# Patient Record
Sex: Female | Born: 1980 | Hispanic: Yes | Marital: Married | Smoking: Never smoker
Health system: Southern US, Community
[De-identification: ages and names within clinical notes are randomized; demographics above are authoritative.]

## PROBLEM LIST (undated history)

## (undated) DIAGNOSIS — E049 Nontoxic goiter, unspecified: Secondary | ICD-10-CM

## (undated) DIAGNOSIS — E6609 Other obesity due to excess calories: Secondary | ICD-10-CM

## (undated) DIAGNOSIS — L709 Acne, unspecified: Secondary | ICD-10-CM

## (undated) DIAGNOSIS — IMO0002 Reserved for concepts with insufficient information to code with codable children: Secondary | ICD-10-CM

## (undated) DIAGNOSIS — R87619 Unspecified abnormal cytological findings in specimens from cervix uteri: Secondary | ICD-10-CM

## (undated) DIAGNOSIS — Z6839 Body mass index (BMI) 39.0-39.9, adult: Secondary | ICD-10-CM

## (undated) DIAGNOSIS — D689 Coagulation defect, unspecified: Secondary | ICD-10-CM

## (undated) DIAGNOSIS — T7840XA Allergy, unspecified, initial encounter: Secondary | ICD-10-CM

## (undated) HISTORY — PX: TONSILLECTOMY: SUR1361

## (undated) HISTORY — PX: TUBAL LIGATION: SHX77

## (undated) HISTORY — DX: Coagulation defect, unspecified: D68.9

## (undated) HISTORY — DX: Acne, unspecified: L70.9

## (undated) HISTORY — DX: Other obesity due to excess calories: E66.09

## (undated) HISTORY — DX: Unspecified abnormal cytological findings in specimens from cervix uteri: R87.619

## (undated) HISTORY — DX: Body mass index (bmi) 39.0-39.9, adult: Z68.39

## (undated) HISTORY — DX: Reserved for concepts with insufficient information to code with codable children: IMO0002

## (undated) HISTORY — DX: Nontoxic goiter, unspecified: E04.9

## (undated) HISTORY — DX: Allergy, unspecified, initial encounter: T78.40XA

---

## 2007-11-23 HISTORY — PX: OTHER SURGICAL HISTORY: SHX169

## 2009-12-08 ENCOUNTER — Ambulatory Visit: Payer: Self-pay | Admitting: Advanced Practice Midwife

## 2011-01-18 ENCOUNTER — Other Ambulatory Visit: Payer: Self-pay | Admitting: Physician Assistant

## 2011-01-18 ENCOUNTER — Ambulatory Visit: Payer: 59

## 2011-01-18 DIAGNOSIS — Z01419 Encounter for gynecological examination (general) (routine) without abnormal findings: Secondary | ICD-10-CM

## 2011-01-18 DIAGNOSIS — B3731 Acute candidiasis of vulva and vagina: Secondary | ICD-10-CM

## 2011-01-18 DIAGNOSIS — Z1272 Encounter for screening for malignant neoplasm of vagina: Secondary | ICD-10-CM

## 2011-01-18 DIAGNOSIS — B373 Candidiasis of vulva and vagina: Secondary | ICD-10-CM

## 2011-01-18 DIAGNOSIS — N920 Excessive and frequent menstruation with regular cycle: Secondary | ICD-10-CM

## 2011-02-12 NOTE — Assessment & Plan Note (Signed)
NAMEGeanie Guzman            ACCOUNT NO.:  [id number]  MEDICAL RECORD NO.:  [id number]           PATIENT TYPE:  LOCATION:  [facility] [place]           FACILITY:  PHYSICIAN:  Maylon Cos, CNM         DATE OF BIRTH:  DATE OF SERVICE:                                 CLINIC NOTE  DATE OF SERVICE:  January 18, 2011.  The patient presents to the [facility] for [facility] [place].  REASON FOR TODAY'S VISIT:  Annual exam and Pap smear.  The patient also has complaints of foul-smelling discharge that she has had on and off for approximately 3 years.  She denies itching or irritation or pain with intercourse, spotting with intercourse, or abnormal bleeding in between her periods.  HISTORY:  First day of her last menstrual period was approximately 2 weeks ago.  Birth control is bilateral tubal ligation 3 years ago.  ALLERGIES:  No known drug allergies.  She is not currently taking any medications.  MENSTRUAL HISTORY:  She had menarche at the age of 59, regular cycles, approximately 28 days that last 5 days.  Obstetrical history is unchanged.  She is a gravida 2, para 2-0-0-2 with no complications.  Had her tubes tied with her last baby; however, she is desiring possible tubal reversal.  Gynecological history is remarkable for an abnormal Pap smear in November 2009 at which time she also had a LEEP.  All Pap smears have been normal since then.  Surgical history is unchanged.  Family history is unremarkable.  MEDICAL HISTORY:  Continues to be positive for asthma that she reports as being mild and only having to use an inhaler with exercise.  SOCIAL HISTORY:  Unchanged.  She is unmarried.  She lives with her two sons.  She works and does not use alcohol, tobacco, or illicit drugs.  PHYSICAL EXAMINATION:  VITAL SIGNS:  Stable, pulse is 81, her blood pressure is 122/71, her weight is 168, and her height is 65 inches.  Her BMI is 27.  HEENT:  Grossly normal  with good dentition, however, she does have enlargement of her thyroid on both sides.  Presence of a small goiter. HEART:  Regular rate and rhythm without murmurs or bruits. LUNGS:  Clear to auscultation bilaterally, A and P. ABDOMEN:  Soft and nontender with positive bowel sounds in all four quadrants.  No hepatosplenomegaly. LYMPHATICS:  She has no lymphadenopathy throughout. PELVIC:  No CVA tenderness.  External genitalia without lesions.  Mucous membranes are smooth and pink without lesions.  She does have a moderate amount of an odorous, creamy discharge noted in the vault.  Her cervix is nonfriable and is parous.  Bimanual exam reveals nonenlarged, nontender uterus with nonenlarged, nontender adnexa bilaterally. RECTAL:  Deferred given her age and no complaints. EXTREMITIES:  She does have eczema and excessively dry skin on her lower extremities.  She does have some patches of eczema on her left forearm that she is currently being treated for. SKIN:  She does have facial acne noted on her chin and neck area as well as upper portion of her chest.  Wet prep does reveal too numerous to count hyphae and many clue cells. No  Trichomonas.  ASSESSMENT: 1. Well-woman exam. 2. Enlarged thyroid. 3. Adult acne. 4. Eczema. 5. Bacterial vaginosis and Candida.  PLAN: 1. Pap smear was obtained and sent per routine without reflex for GC     Chlamydia per patient request. 2. We will assess thyroid function with a TSH and panel today and     refer to Endocrinology as necessary. 3. The patient should continue her followup plan for eczema with her     dermatologist in [place].  She is also instructed to discuss     menstrual acne and adult acne with her dermatologist as well. 4. The patient is encouraged to increase daily exercise.  Multivitamin     daily.  She is also referred to [facility] for possible tubal reversal.     The patient should follow up as needed or in 12 months for her  next     physical and Pap smear.          ______________________________ Maylon Cos, CNM    SS/MEDQ  D:  01/18/2011  T:  01/19/2011  Job:  [id number]

## 2011-04-06 NOTE — Assessment & Plan Note (Signed)
Chelsey Guzman, Chelsey Guzman            ACCOUNT NO.:  [id number]   MEDICAL RECORD NO.:  [id number]          PATIENT TYPE:  POB   LOCATION:  [facility] [place]         FACILITY:  [facility]   PHYSICIAN:  Wynelle Bourgeois, CNM    DATE OF BIRTH:  Jan 09, 1981   DATE OF SERVICE:  12/08/2009                                  CLINIC NOTE   This is an annual exam done on 12/08/2009.  This is a 30 year old  gravida 2, para 2, who is not pregnant, who presents today for annual  exam.  It has been a year and a half since her last Pap.  She has  several complaints today including intermittent vaginal discharge with  intermittent odor, history of LEEP procedure in 2009, request for a GC  and Chlamydia testing, and left pelvic pain for 3 weeks with  intercourse.  She also complains of some urge incontinence.  She also  has a history of asthma but plans to follow up with that at the family  practice doctor.  She has recently moved into town and is in the process  of obtaining a primary physician.  The vaginal discharge that she  reports is described as a heavy mucus vaginal discharge, which sometimes  has an odor but does not produce much itching.  The pelvic pain she is  describing is intermittent left lower quadrant pain with intercourse  that she has had for about 3 weeks and the urge incontinence she  describes as feeling like she has to go to the bathroom frequently and  occurs mainly with the urge to go, at which time, she feels like she  needs to go to the bathroom very quickly or else she will start leaking.  She is concerned that this may mean she is pregnant despite having a  tubal ligation in January 2009.   MENSTRUAL HISTORY:  Menses commenced at age 46.  She has regular cycles,  which last about 28 days.  Her periods last for about 5 days.  She  reports bleeding is heavy with mild-to-moderate cramps and no bleeding  in between periods.   OBSTETRICS HISTORY:  Remarkable for 2 vaginal deliveries  and no  complications with those.  She had her tubes tied after the last baby.   GYNECOLOGIC HISTORY:  Remarkable for last Pap smear in November 2009.  She had a LEEP following that for an abnormal Pap smear and did well  after that.   SURGICAL HISTORY:  Remarkable for tubal ligation in 2009 at [facility].   FAMILY HISTORY:  Remarkable for sister with diabetes.   MEDICAL HISTORY:  Remarkable for asthma, which she reports as being  mild.   SOCIAL HISTORY:  She lives in her house with her 2 sons.  She does work.  She does not use any alcohol, tobacco, or illicit drugs.   OBJECTIVE DATA:  VITAL SIGNS:  Stable.  Weight 162, height 65 inches,  blood pressure is 125/79.  HEENT:  Within normal limits.  CHEST:  Clear.  HEART:  Regular rate and rhythm.  NECK:  Thyroid is remarkable for mild enlargement.  There are no nodules  palpable, but the thyroid does appear mildly  enlarged.  She states that  she has been told in the past this was due to enlarged tonsils but has  never been tested for thyroid disorders.  BREASTS:  Soft and nontender without masses.  ABDOMEN:  Soft and nontender with no rebound or guarding.  No masses are  palpable.  Liver and spleen are normal.  PELVIS:  External genitalia with normal appearance.  There are no  lesions or erythema noted.  Vagina is pink, moist, and well-rugated.  Cervix is normal, closed, and long with no noticeable vaginal discharge  other than physiologic discharge noted.  Pap and GC, Chlamydia were  obtained.  UPT was negative.  Bimanual exam shows a small anteverted  uterus with no masses or areas of tenderness.  Adnexa are clear without  masses, and the patient does not appear tender with palpation of either  side, although she reports that it was slightly tender on the left.  RECTAL:  Deferred.  EXTREMITIES:  Within normal limits.   ASSESSMENT:  1. Annual exam.  2. Status post bilateral tubal ligation in 2009.  3.  Physiologic vaginal discharge.  4. History of abnormal Pap, status post loop electrocautery excision      procedure.  5. Requests GC, Chlamydia testing, but declines another sexually      transmitted disease testing.  6. Left lower quadrant pelvic pain/dyspareunia.  7. Urge incontinence.  8. Thyroid enlargement.   PLAN:  1. Pap and GC, Chlamydia sent.  2. Reassured the patient that urinary pregnancy test was negative.  3. We will get pelvic ultrasound to rule out pathology.  4. The patient taught Kegel exercises and demonstrated them      sufficiently.  We will try Kegel exercises for 1 month and follow      up after that.  If no improvement, we will refer to Urology.  5. TSH, T3, and free T4 to evaluate thyroid enlargement with probable      referral to Internal Medicine if abnormal.           ______________________________  Wynelle Bourgeois, CNM     MW/MEDQ  D:  12/08/2009  T:  12/09/2009  Job:  [id number]

## 2012-01-10 ENCOUNTER — Encounter: Payer: Self-pay | Admitting: Physician Assistant

## 2012-01-10 ENCOUNTER — Ambulatory Visit (INDEPENDENT_AMBULATORY_CARE_PROVIDER_SITE_OTHER): Payer: BC Managed Care – PPO | Admitting: Physician Assistant

## 2012-01-10 DIAGNOSIS — Z13220 Encounter for screening for lipoid disorders: Secondary | ICD-10-CM

## 2012-01-10 DIAGNOSIS — E049 Nontoxic goiter, unspecified: Secondary | ICD-10-CM

## 2012-01-10 DIAGNOSIS — Z23 Encounter for immunization: Secondary | ICD-10-CM

## 2012-01-10 DIAGNOSIS — R6889 Other general symptoms and signs: Secondary | ICD-10-CM

## 2012-01-10 DIAGNOSIS — L708 Other acne: Secondary | ICD-10-CM

## 2012-01-10 DIAGNOSIS — IMO0002 Reserved for concepts with insufficient information to code with codable children: Secondary | ICD-10-CM | POA: Insufficient documentation

## 2012-01-10 DIAGNOSIS — Z1272 Encounter for screening for malignant neoplasm of vagina: Secondary | ICD-10-CM

## 2012-01-10 DIAGNOSIS — R5383 Other fatigue: Secondary | ICD-10-CM

## 2012-01-10 DIAGNOSIS — Z113 Encounter for screening for infections with a predominantly sexual mode of transmission: Secondary | ICD-10-CM

## 2012-01-10 DIAGNOSIS — R519 Headache, unspecified: Secondary | ICD-10-CM

## 2012-01-10 DIAGNOSIS — Z0000 Encounter for general adult medical examination without abnormal findings: Secondary | ICD-10-CM

## 2012-01-10 DIAGNOSIS — R5381 Other malaise: Secondary | ICD-10-CM

## 2012-01-10 DIAGNOSIS — E010 Iodine-deficiency related diffuse (endemic) goiter: Secondary | ICD-10-CM | POA: Insufficient documentation

## 2012-01-10 DIAGNOSIS — L709 Acne, unspecified: Secondary | ICD-10-CM

## 2012-01-10 DIAGNOSIS — R51 Headache: Secondary | ICD-10-CM

## 2012-01-10 HISTORY — DX: Nontoxic goiter, unspecified: E04.9

## 2012-01-10 HISTORY — DX: Acne, unspecified: L70.9

## 2012-01-10 MED ORDER — CLINDAMYCIN PHOS-BENZOYL PEROX 1-5 % EX GEL: Freq: Two times a day (BID) | CUTANEOUS | Status: DC

## 2012-01-10 MED ORDER — TINIDAZOLE 500 MG PO TABS: 1.0000 g | ORAL_TABLET | Freq: Two times a day (BID) | ORAL | Status: AC

## 2012-01-10 NOTE — Progress Notes (Signed)
Addended by: Granville Lewis on: 01/10/2012 11:23 AM   Modules accepted: Orders

## 2012-01-10 NOTE — Patient Instructions (Addendum)
Pap Test A Pap test is a sampling of cells from a woman's cervix. The cervix is the opening between the vagina (birth canal) and the uterus (the bottom part of the womb). The cells are scraped from the cervix during a pelvic exam. These cells are then looked at under a microscope to see if the cells are normal or to see if a cancer is developing or there are changes that suggest a cancer will develop. Cervical dysplasia is a condition in which a woman has abnormal changes in the top layer of cells of her cervix. These changes are an early sign that cervical cancer may develop. Pap tests also look for the human papilloma virus (HPV) because it has 4 types that are responsible for 70% of cervical cancer. Infections can also be found during a Pap test such as bacteria, fungus, protozoa and viruses.  Cervical cancer is harder to treat and less likely to have a good outcome if left untreated. Catching the disease at an early stage leads to a better outcome. Since the Pap test was introduced 60 years ago, deaths from cervical cancer have decreased by 70%. Every woman should keep up to date with Pap tests. RISK FACTORS FOR CERVICAL CANCER INCLUDE:   Becoming sexually active before age 69.   Being the daughter of a woman who took diethylstilbestrol (DES) during pregnancy.   Having a sexual partner who has or has had cancer of the penis.   Having a sexual partner whose past partner had cervical cancer or cervical dysplasia (early cell changes which suggest a cancer may develop).   Having a weakened immune system. An example would be HIV or other immunodeficiency disorder.   Having had a sexually transmitted infection such as chlamydia, gonorrhea or HPV.   Having had an abnormal Pap or cancer of the vagina or vulva.   Having had more than one sexual partner.   A history of cervical cancer in a woman's sister or mother.   Not using condoms with new sexual partners.   Smoking.  WHO SHOULD HAVE PAP  TESTS  A Pap test is done to screen for cervical cancer.   The first Pap test should be done at age 50.   Between ages 33 and 77, Pap tests are repeated every 2 years.   Beginning at age 75, you are advised to have a Pap test every 3 years as long as your past 3 Pap tests have been normal.   Some women have medical problems that increase the chance of getting cervical cancer. Talk to your caregiver about these problems. It is especially important to talk to your caregiver if a new problem develops soon after your last Pap test. In these cases, your caregiver may recommend more frequent screening and Pap tests.   The above recommendations are the same for women who have or have not gotten the vaccine for HPV (Human Papillomavirus).   If you had a hysterectomy for a problem that was not a cancer or a condition that could lead to cancer, then you no longer need Pap tests. However, even if you no longer need a Pap test, a regular exam is a good idea to make sure no other problems are starting.    If you are between ages 71 and 24, and you have had normal Pap tests going back 10 years, you no longer need Pap tests. However, even if you no longer need a Pap test, a regular exam is a good idea  to make sure no other problems are starting.    If you have had past treatment for cervical cancer or a condition that could lead to cancer, you need Pap tests and screening for cancer for at least 20 years after your treatment.   If Pap tests have been discontinued, risk factors (such as a new sexual partner) need to be re-assessed to determine if screening should be resumed.   Some women may need screenings more often if they are at high risk for cervical cancer.  PREPARATION FOR A PAP TEST A Pap test should be performed during the weeks before the start of menstruation. Women should not douche or have sexual intercourse for 24 hours before the test. No vaginal creams, diaphragms, or tampons should be  used for 24 hours before the test. To minimize discomfort, a woman should empty her bladder just before the exam. TAKING THE PAP TEST The caregiver will perform a pelvic exam. A metal or plastic instrument (speculum) is placed in the vagina. This is done before your caregiver does a bimanual exam of your internal female organs. This instrument allows your caregiver to see the inside of the vagina and look at the cervix. A small, sterile brush is used to take a sample of cells from the internal opening of the cervix. A small wooden spatula is used to scrape the outside of the cervix. Neither of these two methods to collect cells will cause you pain. These two scrapings are placed on a glass slide or in a small bottle filled with a special liquid. The cells are looked at later under a microscope in a lab. A specialist will look at these cells and determine if the cells are normal. RESULTS OF YOUR PAP TEST  A healthy Pap test shows no abnormal cells or evidence of inflammation.   The presence of abnormally growing cells on the surface of the cervix may be reported as an abnormal Pap test. Different categories of findings are used to describe your Pap test. Your caregiver will go over the importance of these findings with you. The caregiver will then determine what follow-up is needed or when you should have your next pap test.   If you have had two or more abnormal Pap tests:   You may be asked to have a colposcopy. This is a test in which the cervix is viewed with a special lighted microscope.   A cervical tissue sample (biopsy) may also be needed. This involves taking a small tissue sample from the cervix. The sample is looked at under a microscope to find the cause of the abnormal cells. Make sure you find out the results of the Pap test. If you have not received the results within two weeks, contact your caregiver's office for the results. Do not assume everything is normal if you have not heard from  your caregiver or medical facility. It is important to follow up on all of your test results.  Document Released: 01/29/2003 Document Revised: 07/21/2011 Document Reviewed: 04/01/2008 [facility] Patient Information 2012 [place], [place].Bacterial Vaginosis Bacterial vaginosis (BV) is a vaginal infection where the normal balance of bacteria in the vagina is disrupted. The normal balance is then replaced by an overgrowth of certain bacteria. There are several different kinds of bacteria that can cause BV. BV is the most common vaginal infection in women of childbearing age. CAUSES   The cause of BV is not fully understood. BV develops when there is an increase or imbalance of harmful  bacteria.   Some activities or behaviors can upset the normal balance of bacteria in the vagina and put women at increased risk including:   Having a new sex partner or multiple sex partners.   Douching.   Using an intrauterine device (IUD) for contraception.   It is not clear what role sexual activity plays in the development of BV. However, women that have never had sexual intercourse are rarely infected with BV.  Women do not get BV from toilet seats, bedding, swimming pools or from touching objects around them.  SYMPTOMS   Grey vaginal discharge.   A fish-like odor with discharge, especially after sexual intercourse.   Itching or burning of the vagina and vulva.   Burning or pain with urination.   Some women have no signs or symptoms at all.  DIAGNOSIS  Your caregiver must examine the vagina for signs of BV. Your caregiver will perform lab tests and look at the sample of vaginal fluid through a microscope. They will look for bacteria and abnormal cells (clue cells), a pH test higher than 4.5, and a positive amine test all associated with BV.  RISKS AND COMPLICATIONS   Pelvic inflammatory disease (PID).   Infections following gynecology surgery.   Developing HIV.   Developing herpes virus.    TREATMENT  Sometimes BV will clear up without treatment. However, all women with symptoms of BV should be treated to avoid complications, especially if gynecology surgery is planned. Female partners generally do not need to be treated. However, BV may spread between female sex partners so treatment is helpful in preventing a recurrence of BV.   BV may be treated with antibiotics. The antibiotics come in either pill or vaginal cream forms. Either can be used with nonpregnant or pregnant women, but the recommended dosages differ. These antibiotics are not harmful to the baby.   BV can recur after treatment. If this happens, a second round of antibiotics will often be prescribed.   Treatment is important for pregnant women. If not treated, BV can cause a premature delivery, especially for a pregnant woman who had a premature birth in the past. All pregnant women who have symptoms of BV should be checked and treated.   For chronic reoccurrence of BV, treatment with a type of prescribed gel vaginally twice a week is helpful.  HOME CARE INSTRUCTIONS   Finish all medication as directed by your caregiver.   Do not have sex until treatment is completed.   Tell your sexual partner that you have a vaginal infection. They should see their caregiver and be treated if they have problems, such as a mild rash or itching.   Practice safe sex. Use condoms. Only have 1 sex partner.  PREVENTION  Basic prevention steps can help reduce the risk of upsetting the natural balance of bacteria in the vagina and developing BV:  Do not have sexual intercourse (be abstinent).   Do not douche.   Use all of the medicine prescribed for treatment of BV, even if the signs and symptoms go away.   Tell your sex partner if you have BV. That way, they can be treated, if needed, to prevent reoccurrence.  SEEK MEDICAL CARE IF:   Your symptoms are not improving after 3 days of treatment.   You have increased discharge, pain,  or fever.  MAKE SURE YOU:   Understand these instructions.   Will watch your condition.   Will get help right away if you are not doing well  or get worse.  FOR MORE INFORMATION  Division of STD Prevention (DSTDP), Centers for Disease Control and Prevention: [employer] American Social Health Association (ASHA): www.ashastd.org  Document Released: 11/08/2005 Document Revised: 07/21/2011 Document Reviewed: 05/01/2009 [facility] Patient Information 2012 [place], [place].

## 2012-01-10 NOTE — Progress Notes (Signed)
Chief Complaint:  Gynecologic Exam   Chelsey Guzman is  31 y.o. [id number].  Patient's last menstrual period was 01/07/2012..   She presents complaining of Gynecologic Exam  Continued complaint of frequent BV infections. Reports odor most days of the week with occassional discharge. Declines STI screening today. Visit earlier today with Lesly Rubenstein, PA at [facility] for further evaluation of weight gain: ~ 20 pounds in 2-3 months. TSH pending per patient.  Reports irregular periods for the past two months. Increase stress related to single parent, working, and in school. Also recent death of sister.   Obstetrical/Gynecological History: Pertinent Gynecological History: Menses: flow is light, usually lasting 3 to 5 days and with minimal cramping Bleeding: irregular x last 2 months Contraception: tubal ligation DES exposure: unknown Blood transfusions: none Sexually transmitted diseases: no past history Previous GYN Procedures: LEEP  Last pap: normal Date: 12/2010   Past Medical History: Past Medical History  Diagnosis Date  . Asthma   . Abnormal Pap smear     leep  . Thyroid enlargement 01/10/2012  . Acne 01/10/2012    Past Surgical History: Past Surgical History  Procedure Date  . Btl 2009    Family History: Family History  Problem Relation Age of Onset  . Diabetes Sister     Social History: History  Substance Use Topics  . Smoking status: Never Smoker   . Smokeless tobacco: Never Used  . Alcohol Use: No    Allergies: No Known Allergies   (Not in a hospital admission)  Review of Systems - Negative except what has been reviewed in HPI  Physical Exam   Blood pressure 117/76, pulse 87, temperature 98.5 F (36.9 C), temperature source Oral, resp. rate 16, height 5\' 5"  (1.651 m), weight 179 lb (81.194 kg), last menstrual period 01/07/2012.  General: General appearance - alert, well appearing, and in no distress, oriented to person, place, and time and  overweight Mental status - alert, oriented to person, place, and time, normal mood, behavior, speech, dress, motor activity, and thought processes, affect appropriate to mood Lymphatics - no palpable lymphadenopathy, no hepatosplenomegaly Chest - clear to auscultation, no wheezes, rales or rhonchi, symmetric air entry Heart - normal rate, regular rhythm, normal S1, S2, no murmurs, rubs, clicks or gallops Abdomen - soft, nontender, nondistended, no masses or organomegaly Breasts - breasts appear normal, no suspicious masses, no skin or nipple changes or axillary nodes Back exam - full range of motion, no tenderness, palpable spasm or pain on motion Neurological - alert, oriented, normal speech, no focal findings or movement disorder noted, screening mental status exam normal, cranial nerves II through XII intact Musculoskeletal - no joint tenderness, deformity or swelling Extremities - peripheral pulses normal, no pedal edema, no clubbing or cyanosis Skin - acne of chin and chest Focused Gynecological Exam: VULVA: normal appearing vulva with no masses, tenderness or lesions, VAGINA: normal appearing vagina with normal color and blood-tinged d/c, malodorous, no lesions, CERVIX: normal appearing cervix without discharge or lesions, UTERUS: uterus is normal size, shape, consistency and nontender, ADNEXA: normal adnexa in size, nontender and no masses     Assessment: Well Women Exam with Pap Patient Active Problem List  Diagnoses  . Abnormal Pap smear  . Thyroid enlargement  . Acne    Plan: Pap obtained and sent per routine Rx Tindamax and Luvena for recurrent BV, rec daily probiotic, stop duching, and unscented soaps/detergents for prevention. Recommend daily multivitamin and exercise for weight loss and stress management Rx Clinda/Benzol  Peroxide BID sent to pharm for acne RTC 12 months or prn problems Will obtain records from [facility] r/t thyroid testing  Chelsey Guzman  E. 01/10/2012,11:11 AM

## 2012-01-10 NOTE — Patient Instructions (Signed)
Consider using Excedrin instead of Tylenol for headaches. If headaches persist after excedrin call back in 1 week and we will order CT of head. Gave orders for labs. Get done after fasting for 8 hours. Schedule a CPE in 2 weeks. Will go over labs at that time.

## 2012-01-11 NOTE — Progress Notes (Signed)
  Subjective:    Patient ID: Chelsey Guzman, female    DOB: Dec 08, 1980, 31 y.o.   MRN: [id number]  HPI Patient presents to the clinic to establish care. Patient takes no medication. For the last month patient has been having headaches first thing in the am after she wakes up. She has no history of migraines. She has no changed her diet and she does not drink a lot of caffeine. She might has 1 caffinated beverage every other day. Her headaches are only on the left side and she describes them as pulsated. She does not have them everyday but she has had them 2-3 days a week. She is sensitive to light. Denies N/V, vision changes, or sensitvity to sound. She has appointment to see eye doctor because she wears glasses but has not had a check up recently. She has tried tylenol and it does help.   She also complains of fatigue, hair thinning and weight gain. She has not had problems with her thyroid in the past. She did have her TSH tested a couple of years ago and was neg.   Review of Systems     Objective:   Physical Exam  Constitutional: She is oriented to person, place, and time. She appears well-developed and well-nourished.  HENT:  Head: Normocephalic and atraumatic.       No tenderness with palpation of scalp.  Eyes: Conjunctivae and EOM are normal. Pupils are equal, round, and reactive to light.  Neck: Normal range of motion. Thyromegaly present.       Thyroid feels enlarged bilaterally.   Cardiovascular: Normal rate, regular rhythm and normal heart sounds.   Pulmonary/Chest: Effort normal and breath sounds normal. She has no wheezes.  Neurological: She is alert and oriented to person, place, and time.       Cranial nerves II-IX intact.  Skin: Skin is warm and dry.  Psychiatric: She has a normal mood and affect. Her behavior is normal.          Assessment & Plan:  Left-sided headaches- I told her to try Excedrin migraine for headaches. I also informed her to keep a headache diary so  we could look at how many headaches she is having weekly and maybe see if there are any triggers. We will consider CT/MRI of head if headaches continue. Patient is very worried because she had a co-worker die from aneurysm and she was having headaches.   Fatigue/Enlarge thyroid-TSH ordered. Will call with results. Talked about diet and exercise having a lot to do with energy.   Got labs for CPE to be scheduled. Patient will come back fasting.   Tdap given.

## 2012-01-22 LAB — COMPREHENSIVE METABOLIC PANEL
ALT: 11 U/L (ref 0–35)
AST: 22 U/L (ref 0–37)
Albumin: 4.1 g/dL (ref 3.5–5.2)
Alkaline Phosphatase: 59 U/L (ref 39–117)
BUN: 10 mg/dL (ref 6–23)
CO2: 24 mEq/L (ref 19–32)
Calcium: 9.3 mg/dL (ref 8.4–10.5)
Chloride: 103 mEq/L (ref 96–112)
Creat: 0.8 mg/dL (ref 0.50–1.10)
Glucose, Bld: 75 mg/dL (ref 70–99)
Potassium: 4.1 mEq/L (ref 3.5–5.3)
Sodium: 138 mEq/L (ref 135–145)
Total Bilirubin: 0.4 mg/dL (ref 0.3–1.2)
Total Protein: 7.2 g/dL (ref 6.0–8.3)

## 2012-01-22 LAB — LIPID PANEL
Cholesterol: 214 mg/dL — ABNORMAL HIGH (ref 0–200)
HDL: 57 mg/dL (ref >39)
LDL Cholesterol: 149 mg/dL — ABNORMAL HIGH (ref 0–99)
Total CHOL/HDL Ratio: 3.8 Ratio
Triglycerides: 38 mg/dL (ref <150)
VLDL: 8 mg/dL (ref 0–40)

## 2012-01-22 LAB — TSH: TSH: 2.295 u[IU]/mL (ref 0.350–4.500)

## 2012-01-28 ENCOUNTER — Ambulatory Visit (INDEPENDENT_AMBULATORY_CARE_PROVIDER_SITE_OTHER): Payer: BC Managed Care – PPO | Admitting: Physician Assistant

## 2012-01-28 ENCOUNTER — Encounter: Payer: Self-pay | Admitting: Physician Assistant

## 2012-01-28 VITALS — BP 130/75 | HR 92 | Ht 65.0 in | Wt 180.0 lb

## 2012-01-28 DIAGNOSIS — R5383 Other fatigue: Secondary | ICD-10-CM

## 2012-01-28 DIAGNOSIS — Z0000 Encounter for general adult medical examination without abnormal findings: Secondary | ICD-10-CM

## 2012-01-28 DIAGNOSIS — R5381 Other malaise: Secondary | ICD-10-CM

## 2012-01-28 DIAGNOSIS — E049 Nontoxic goiter, unspecified: Secondary | ICD-10-CM

## 2012-01-28 MED ORDER — CLOBETASOL PROPIONATE 0.05 % EX CREA: TOPICAL_CREAM | Freq: Two times a day (BID) | CUTANEOUS | Status: DC

## 2012-01-28 NOTE — Patient Instructions (Addendum)
Eggs for breakfast can help keep you full. You need to estimate 35% daily calories need to be protein. Can try Dr. Neil Crouch 3 day detox and add cinnamon 1 tsp in yogurt or smoothie. Consider walking or some type of exercise at least 3 times a week. Calcium 500mg  twice a day. Get labs will call with results. Will call with appointment for thyroid scan.

## 2012-01-29 LAB — CBC
HCT: 39.5 % (ref 36.0–46.0)
Hemoglobin: 13.1 g/dL (ref 12.0–15.0)
MCH: 29.6 pg (ref 26.0–34.0)
MCHC: 33.2 g/dL (ref 30.0–36.0)
MCV: 89.4 fL (ref 78.0–100.0)
Platelets: 230 10*3/uL (ref 150–400)
RBC: 4.42 MIL/uL (ref 3.87–5.11)
RDW: 12.4 % (ref 11.5–15.5)
WBC: 7.8 10*3/uL (ref 4.0–10.5)

## 2012-01-29 LAB — VITAMIN B12: Vitamin B-12: 283 pg/mL (ref 211–911)

## 2012-01-29 LAB — VITAMIN D 25 HYDROXY (VIT D DEFICIENCY, FRACTURES): Vit D, 25-Hydroxy: 20 ng/mL — ABNORMAL LOW (ref 30–89)

## 2012-01-29 NOTE — Progress Notes (Signed)
  Subjective:     Chelsey Guzman is a 31 y.o. female and is here for a comprehensive physical exam. The patient reports problems - She continues to have problems with faitgue, cold all the time, and her hair continues to thin. We check TSH and was normal. She also continues to have problems with weight gain. She has noticed these issue for the last 4-6 months.   History   Social History  . Marital Status: Single    Spouse Name: N/A    Number of Children: N/A  . Years of Education: N/A   Occupational History  .  [employer]   Social History Main Topics  . Smoking status: Never Smoker   . Smokeless tobacco: Never Used  . Alcohol Use: No  . Drug Use: No  . Sexually Active: Yes -- Female partner(s)   Other Topics Concern  . Not on file   Social History Narrative  . No narrative on file   Health Maintenance  Topic Date Due  . Influenza Vaccine  08/22/2012  . Pap Smear  01/09/2015  . Tetanus/tdap  01/09/2022    The following portions of the patient's history were reviewed and updated as appropriate: allergies, current medications, past family history, past medical history, past social history, past surgical history and problem list.  Review of Systems A comprehensive review of systems was negative.   Objective:    BP 130/75  Pulse 92  Ht 5\' 5"  (1.651 m)  Wt 180 lb (81.647 kg)  BMI 29.95 kg/m2  SpO2 100%  LMP 01/07/2012 General appearance: alert, cooperative and appears stated age Head: Normocephalic, without obvious abnormality, atraumatic Eyes: conjunctivae/corneas clear. PERRL, EOM's intact. Fundi benign. Ears: normal TM's and external ear canals both ears Nose: Nares normal. Septum midline. Mucosa normal. No drainage or sinus tenderness. Throat: lips, mucosa, and tongue normal; teeth and gums normal and  Neck: no adenopathy, no carotid bruit, no JVD, supple, symmetrical, trachea midline and Thyroid is mildly enlarged bilaterally with not tenderness. Back:  symmetric, no curvature. ROM normal. No CVA tenderness. Lungs: clear to auscultation bilaterally Heart: regular rate and rhythm, S1, S2 normal, no murmur, click, rub or gallop Abdomen: soft, non-tender; bowel sounds normal; no masses,  no organomegaly Extremities: extremities normal, atraumatic, no cyanosis or edema Pulses: 2+ and symmetric Skin: eczema - generalized Lymph nodes: Cervical, supraclavicular, and axillary nodes normal. Neurologic: Alert and oriented X 3, normal strength and tone. Normal symmetric reflexes. Normal coordination and gait    Assessment:    Healthy female exam.     Plan:    We reviewed labs that were drawn earlier. TSH was normal; however, patient has had enlarged thyroid for at least 3 years and she is having problems that would seemingly be associated with thyroid problems. We will order a thyroid ultrasound. Will also order CBC, Vit D, and B12. Encouraged patient to have a balanced diet with decrease in saturated fats, increase in proteins and veggies. Exercise is also an important part of weight loss and decreasing LDL. LDL was elevated but does not need medicine at this point. Discussed detox for weight loss along with cinnamon daily to decrease belly fat. 500mg  Bid of calcium daily.Clobetosol cream was given for eczema on areas below the neck.   Pap smear complete at OB/GYN.

## 2012-02-15 ENCOUNTER — Telehealth: Payer: Self-pay | Admitting: Physician Assistant

## 2012-02-15 NOTE — Telephone Encounter (Signed)
Patient is aware of results.

## 2012-02-15 NOTE — Telephone Encounter (Signed)
Called patient and left msg to inform her of ultrasound results of thyroid.

## 2012-02-16 ENCOUNTER — Encounter: Payer: Self-pay | Admitting: *Deleted

## 2012-07-18 ENCOUNTER — Ambulatory Visit (INDEPENDENT_AMBULATORY_CARE_PROVIDER_SITE_OTHER): Payer: BC Managed Care – PPO | Admitting: Family Medicine

## 2012-07-18 ENCOUNTER — Encounter: Payer: Self-pay | Admitting: Family Medicine

## 2012-07-18 VITALS — BP 127/67 | HR 86 | Temp 97.9°F | Wt 184.0 lb

## 2012-07-18 DIAGNOSIS — R110 Nausea: Secondary | ICD-10-CM

## 2012-07-18 DIAGNOSIS — R42 Dizziness and giddiness: Secondary | ICD-10-CM

## 2012-07-18 MED ORDER — PROMETHAZINE HCL 12.5 MG PO TABS: 25.0000 mg | ORAL_TABLET | Freq: Once | ORAL | Status: DC

## 2012-07-18 MED ORDER — PROMETHAZINE HCL 25 MG/ML IJ SOLN
25.0000 mg | Freq: Once | INTRAMUSCULAR | Status: AC
  Administered 2012-07-18: 25 mg via INTRAMUSCULAR

## 2012-07-18 MED ORDER — MECLIZINE HCL 25 MG PO TABS: 25.0000 mg | ORAL_TABLET | Freq: Three times a day (TID) | ORAL | Status: AC | PRN

## 2012-07-18 NOTE — Progress Notes (Addendum)
CC: Chelsey Guzman is a 31 y.o. female is here for Dizziness and Nausea   Subjective: HPI:  Acute care visit for gradual onset of dizziness and nausea that started approximately 48 hours ago and has not gotten better or worse in the past 24 hours. Nausea should the point where she doesn't have much of an appetite. 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 After drinking orange juice on the way over repeat blood sugar was in the 80s however her symptoms have not appreciably changed. Dizziness seems to be worsened by moving objects around her such as when in a car and bright lights. No photophobia. Headache is present located in bilateral temples and fluctuates throughout the day but not to any particular movement, activity, etc. Denies fevers, chills, vomiting, neck pain, rash, motor sensory disturbance other than that listed above, personality change, nor head trauma. Has never had these issues before. No interventions as of yet. Denies ear fullness, pain, nor tinnitus.   Review Of Systems Outlined In HPI  Past Medical History  Diagnosis Date  . Asthma   . Abnormal Pap smear     leep  . Thyroid enlargement 01/10/2012  . Acne 01/10/2012     Family History  Problem Relation Age of Onset  . Diabetes Sister      History  Substance Use Topics  . Smoking status: Never Smoker   . Smokeless tobacco: Never Used  . Alcohol Use: No     Objective: Filed Vitals:   07/18/12 1355  BP: 127/67  Pulse: 86  Temp: 97.9 F (36.6 C)    General: Alert and Oriented, No Acute Distress HEENT: Pupils equal, round, reactive to light. Conjunctivae clear.  External ears unremarkable, canals clear with intact TMs with appropriate landmarks.  Middle ear appears open without effusion. Pink inferior turbinates.  Moist mucous membranes, pharynx without inflammation nor lesions.  Neck supple without  palpable lymphadenopathy nor abnormal masses. Lungs: Clear to auscultation bilaterally, no wheezing/ronchi/rales.  Comfortable work of breathing. Good air movement. Cardiac: Regular rate and rhythm. Normal S1/S2.  No murmurs, rubs, nor gallops.  No carotid bruits Extremities: No peripheral edema.  Strong peripheral pulses.  Mental Status: No depression, anxiety, nor agitation. Skin: Warm and dry. Neuro: CN II-XII grossly intact, full strength/rom of all four extremities, C5/L4/S1 DTRs 2/4 bilaterally, gait normal, rapid alternating movements normal, heel-shin test normal. Normal gait. Dix-Hallpike positive on the left.  Orthostatics negative: Lying 122/76 pulse 80. Seated 118/68 pulse 78. Standing 116/78 pulse 80. Assessment & Plan: Chelsey Guzman was seen today for dizziness and nausea.  Diagnoses and associated orders for this visit:  Nausea - POCT Glucose (CBG) - meclizine (ANTIVERT) 25 MG tablet; Take 1 tablet (25 mg total) by mouth 3 (three) times daily as needed for dizziness or nausea. - Discontinue: promethazine (PHENERGAN) tablet 25 mg; Take 2 tablets (25 mg total) by mouth once. - promethazine (PHENERGAN) injection 25 mg; Inject 1 mL (25 mg total) into the muscle once.  Vertigo - meclizine (ANTIVERT) 25 MG tablet; Take 1 tablet (25 mg total) by mouth 3 (three) times daily as needed for dizziness or nausea.    Discussed with patient diagnosis most likely BPV versus viral labyrinthitis. Gave handout and demonstrated modified Epley maneuver to be performed 3 times a day for one to 2 weeks. Antivert prescription given and urged to only use if absolutely necessary.Signs and symptoms requring emergent/urgent  reevaluation were discussed with the patient.  Return in about 1 week (around 07/25/2012).  Requested Prescriptions   Signed Prescriptions Disp Refills  . meclizine (ANTIVERT) 25 MG tablet 30 tablet 1    Sig: Take 1 tablet (25 mg total) by mouth 3 (three) times daily as needed for  dizziness or nausea.

## 2012-07-18 NOTE — Patient Instructions (Signed)

## 2012-07-19 ENCOUNTER — Ambulatory Visit: Payer: BC Managed Care – PPO | Admitting: Sports Medicine

## 2012-07-19 ENCOUNTER — Ambulatory Visit: Payer: BC Managed Care – PPO | Admitting: Family Medicine

## 2013-08-14 ENCOUNTER — Other Ambulatory Visit: Payer: Self-pay | Admitting: Family Medicine

## 2013-11-26 ENCOUNTER — Encounter: Payer: Self-pay | Admitting: Physician Assistant

## 2013-11-26 ENCOUNTER — Ambulatory Visit (INDEPENDENT_AMBULATORY_CARE_PROVIDER_SITE_OTHER): Payer: BC Managed Care – PPO | Admitting: Physician Assistant

## 2013-11-26 VITALS — BP 114/75 | HR 90 | Temp 98.2°F | Wt 202.0 lb

## 2013-11-26 DIAGNOSIS — R062 Wheezing: Secondary | ICD-10-CM

## 2013-11-26 DIAGNOSIS — J45909 Unspecified asthma, uncomplicated: Secondary | ICD-10-CM

## 2013-11-26 DIAGNOSIS — R05 Cough: Secondary | ICD-10-CM

## 2013-11-26 DIAGNOSIS — R059 Cough, unspecified: Secondary | ICD-10-CM

## 2013-11-26 DIAGNOSIS — E669 Obesity, unspecified: Secondary | ICD-10-CM

## 2013-11-26 MED ORDER — METHYLPREDNISOLONE SODIUM SUCC 125 MG IJ SOLR
125.0000 mg | Freq: Once | INTRAMUSCULAR | Status: AC
  Administered 2013-11-26: 125 mg via INTRAMUSCULAR

## 2013-11-26 MED ORDER — HYDROCODONE-HOMATROPINE 5-1.5 MG/5ML PO SYRP: 5.0000 mL | ORAL_SOLUTION | Freq: Every evening | ORAL | Status: DC | PRN

## 2013-11-26 MED ORDER — BENZONATATE 100 MG PO CAPS: 100.0000 mg | ORAL_CAPSULE | Freq: Three times a day (TID) | ORAL | Status: DC | PRN

## 2013-11-26 MED ORDER — PHENTERMINE HCL 37.5 MG PO TABS: 37.5000 mg | ORAL_TABLET | Freq: Every day | ORAL | Status: DC

## 2013-11-26 MED ORDER — ALBUTEROL SULFATE (2.5 MG/3ML) 0.083% IN NEBU
2.5000 mg | INHALATION_SOLUTION | Freq: Once | RESPIRATORY_TRACT | Status: AC
  Administered 2013-11-26: 2.5 mg via RESPIRATORY_TRACT

## 2013-11-26 NOTE — Patient Instructions (Signed)

## 2013-11-26 NOTE — Progress Notes (Signed)
   Subjective:    Patient ID: Chelsey SoursMadeline Eusebio, female    DOB: [birth year], 33 y.o.   MRN: [id number]  HPI Patient presents to the clinic with cough for 1 week. Cough is dry for the most part but occasionally will have some clear sputum production. Had inhaler at home from prevous sickness and has not used. Symptoms started with some body aches and low grade fever. Some SOB and wheezing. No hx of asthma. Denies any nausea or vomiting. No ST, ear pain, sinus pressure. Tried nyquil with no benefit.     Review of Systems     Objective:   Physical Exam  Constitutional: She is oriented to person, place, and time. She appears well-developed and well-nourished.  HENT:  Head: Normocephalic and atraumatic.  Right Ear: External ear normal.  Left Ear: External ear normal.  Nose: Nose normal.  Mouth/Throat: Oropharynx is clear and moist.  Eyes: Conjunctivae are normal.  Neck: Normal range of motion. Neck supple.  Cardiovascular: Normal rate, regular rhythm and normal heart sounds.   Pulmonary/Chest: Effort normal.  Mild expiratory wheezing at base of left lung.   Lymphadenopathy:    She has no cervical adenopathy.  Neurological: She is alert and oriented to person, place, and time.  Skin: Skin is warm and dry.  Psychiatric: She has a normal mood and affect. Her behavior is normal.          Assessment & Plan:  Asthmatic bronchitis- Nebulizer given in office today of albuterol. Solumedrol 125mg  was given IM today. Discussed with pt I think she is having more symptoms of inflammation than infection. Will treat cough with Tessalon perls during the day and hycodan at bedtime. Offered albuterol inhaler rx, pt declined. Gave HO on bronchitis symptoms. mucinex can also help with chest congestion. Call if not improving in next 48 hours.   Obesity- Discussed diet and weight loss. Declined nutritionist appt at this time. Gave phentermine for 1 month to start when feeling better. Will follow up in 1  month.

## 2013-11-28 ENCOUNTER — Other Ambulatory Visit: Payer: Self-pay | Admitting: Physician Assistant

## 2013-11-28 ENCOUNTER — Telehealth: Payer: Self-pay | Admitting: *Deleted

## 2013-11-28 MED ORDER — ALBUTEROL SULFATE HFA 108 (90 BASE) MCG/ACT IN AERS: 2.0000 | INHALATION_SPRAY | Freq: Four times a day (QID) | RESPIRATORY_TRACT | Status: DC | PRN

## 2013-11-28 MED ORDER — AZITHROMYCIN 250 MG PO TABS: ORAL_TABLET | ORAL | Status: DC

## 2013-11-28 NOTE — Telephone Encounter (Signed)
Laryngitis is typically a symptom of viral illness. Will send abx since not feeling better.

## 2013-11-28 NOTE — Telephone Encounter (Signed)
Pt came into office today because she's not feeling any better & couldn't call because she's lost her voice.

## 2013-11-28 NOTE — Telephone Encounter (Signed)
Pt.notified

## 2013-12-24 ENCOUNTER — Encounter: Payer: Self-pay | Admitting: *Deleted

## 2013-12-24 ENCOUNTER — Ambulatory Visit (INDEPENDENT_AMBULATORY_CARE_PROVIDER_SITE_OTHER): Payer: BC Managed Care – PPO | Admitting: Physician Assistant

## 2013-12-24 VITALS — BP 99/63 | HR 88 | Wt 194.0 lb

## 2013-12-24 DIAGNOSIS — E669 Obesity, unspecified: Secondary | ICD-10-CM

## 2013-12-24 DIAGNOSIS — E66812 Obesity, class 2: Secondary | ICD-10-CM

## 2013-12-24 DIAGNOSIS — E6609 Other obesity due to excess calories: Secondary | ICD-10-CM

## 2013-12-24 DIAGNOSIS — Z6839 Body mass index (BMI) 39.0-39.9, adult: Secondary | ICD-10-CM

## 2013-12-24 DIAGNOSIS — R635 Abnormal weight gain: Secondary | ICD-10-CM

## 2013-12-24 HISTORY — DX: Obesity, class 2: E66.812

## 2013-12-24 HISTORY — DX: Body mass index (BMI) 39.0-39.9, adult: Z68.39

## 2013-12-24 HISTORY — DX: Other obesity due to excess calories: E66.09

## 2013-12-24 MED ORDER — PHENTERMINE HCL 15 MG PO CAPS: 15.0000 mg | ORAL_CAPSULE | ORAL | Status: DC

## 2013-12-24 NOTE — Progress Notes (Signed)
   Subjective:    Patient ID: Chelsey Guzman, female    DOB: Feb 13, 1981, 33 y.o.   MRN: [id number] Pt here for bp/weight check.  She has only been taking the phentermine for about 2 weeks & is down 8lbs.  She states that the very first day she took it she felt very tachy. Now after that first day she feels a little dizzy about an hour after taking it & the dizziness gets worse as the day goes on without eating.  She states that she "hates it", as in hates the way it makes her feel.  Donne AnonAmber Blimi Godby, CMA HPI    Review of Systems     Objective:   Physical Exam        Assessment & Plan:  Since pt is having a few side effects I suggest decreasing to 15mg  tablet and recheck in 1 month. Vitals are reassuring.  Tandy GawJade Breeback PA-C

## 2014-01-15 ENCOUNTER — Telehealth: Payer: Self-pay | Admitting: *Deleted

## 2014-01-15 DIAGNOSIS — Z0000 Encounter for general adult medical examination without abnormal findings: Secondary | ICD-10-CM

## 2014-01-15 NOTE — Telephone Encounter (Signed)
Labs ordered for CPE

## 2014-01-16 LAB — CBC WITH DIFFERENTIAL/PLATELET
Basophils Absolute: 0 10*3/uL (ref 0.0–0.1)
Basophils Relative: 0 % (ref 0–1)
Eosinophils Absolute: 0.1 10*3/uL (ref 0.0–0.7)
Eosinophils Relative: 1 % (ref 0–5)
HCT: 41 % (ref 36.0–46.0)
Hemoglobin: 13.6 g/dL (ref 12.0–15.0)
Lymphocytes Relative: 37 % (ref 12–46)
Lymphs Abs: 2.6 10*3/uL (ref 0.7–4.0)
MCH: 28.9 pg (ref 26.0–34.0)
MCHC: 33.2 g/dL (ref 30.0–36.0)
MCV: 87 fL (ref 78.0–100.0)
Monocytes Absolute: 0.6 10*3/uL (ref 0.1–1.0)
Monocytes Relative: 8 % (ref 3–12)
Neutro Abs: 3.7 10*3/uL (ref 1.7–7.7)
Neutrophils Relative %: 54 % (ref 43–77)
Platelets: 126 10*3/uL — ABNORMAL LOW (ref 150–400)
RBC: 4.71 MIL/uL (ref 3.87–5.11)
RDW: 13.3 % (ref 11.5–15.5)
WBC: 6.9 10*3/uL (ref 4.0–10.5)

## 2014-01-16 LAB — COMPREHENSIVE METABOLIC PANEL
ALT: 11 U/L (ref 0–35)
AST: 19 U/L (ref 0–37)
Albumin: 4.3 g/dL (ref 3.5–5.2)
Alkaline Phosphatase: 57 U/L (ref 39–117)
BUN: 12 mg/dL (ref 6–23)
CO2: 24 mEq/L (ref 19–32)
Calcium: 9.2 mg/dL (ref 8.4–10.5)
Chloride: 104 mEq/L (ref 96–112)
Creat: 0.81 mg/dL (ref 0.50–1.10)
Glucose, Bld: 83 mg/dL (ref 70–99)
Potassium: 3.6 mEq/L (ref 3.5–5.3)
Sodium: 136 mEq/L (ref 135–145)
Total Bilirubin: 0.5 mg/dL (ref 0.2–1.2)
Total Protein: 7.4 g/dL (ref 6.0–8.3)

## 2014-01-16 LAB — LIPID PANEL
Cholesterol: 184 mg/dL (ref 0–200)
HDL: 44 mg/dL (ref >39)
LDL Cholesterol: 130 mg/dL — ABNORMAL HIGH (ref 0–99)
Total CHOL/HDL Ratio: 4.2 Ratio
Triglycerides: 51 mg/dL (ref <150)
VLDL: 10 mg/dL (ref 0–40)

## 2014-01-16 LAB — TSH: TSH: 1.538 u[IU]/mL (ref 0.350–4.500)

## 2014-01-21 ENCOUNTER — Encounter: Payer: Self-pay | Admitting: Physician Assistant

## 2014-01-21 ENCOUNTER — Ambulatory Visit (INDEPENDENT_AMBULATORY_CARE_PROVIDER_SITE_OTHER): Payer: BC Managed Care – PPO | Admitting: Physician Assistant

## 2014-01-21 VITALS — BP 106/66 | HR 99 | Wt 192.0 lb

## 2014-01-21 DIAGNOSIS — Z6831 Body mass index (BMI) 31.0-31.9, adult: Secondary | ICD-10-CM

## 2014-01-21 DIAGNOSIS — D696 Thrombocytopenia, unspecified: Secondary | ICD-10-CM

## 2014-01-21 DIAGNOSIS — Z0000 Encounter for general adult medical examination without abnormal findings: Secondary | ICD-10-CM

## 2014-01-21 NOTE — Patient Instructions (Signed)
My [employer] pal  Keeping You Healthy  Get These Tests 1. Blood Pressure- Have your blood pressure checked once a year by your health care provider.  Normal blood pressure is 120/80. 2. Weight- Have your body mass index (BMI) calculated to screen for obesity.  BMI is measure of body fat based on height and weight.  You can also calculate your own BMI at https://www.west-esparza.com/www.nhlbisupport.com/bmi/. 3. Cholesterol- Have your cholesterol checked every 5 years starting at age 31 then yearly starting at age 31. 4. Chlamydia, HIV, and other sexually transmitted diseases- Get screened every year until age 31, then within three months of each new sexual provider. 5. Pap Smear- Every 1-3 years; discuss with your health care provider. 6. Mammogram- Every year starting at age 31  Take these medicines  Calcium with Vitamin D-Your body needs 1200 mg of Calcium each day and [phone] IU of Vitamin D daily.  Your body can only absorb 500 mg of Calcium at a time so Calcium must be taken in 2 or 3 divided doses throughout the day.  Multivitamin with folic acid- Once daily if it is possible for you to become pregnant.  Get these Immunizations  Gardasil-Series of three doses; prevents HPV related illness such as genital warts and cervical cancer.  Menactra-Single dose; prevents meningitis.  Tetanus shot- Every 10 years.  Flu shot-Every year.  Take these steps 1. Do not smoke-Your healthcare provider can help you quit.  For tips on how to quit go to www.smokefree.gov or call 1-800 QUITNOW. 2. Be physically active- Exercise 5 days a week for at least 30 minutes.  If you are not already physically active, start slow and gradually work up to 30 minutes of moderate physical activity.  Examples of moderate activity include walking briskly, dancing, swimming, bicycling, etc. 3. Breast Cancer- A self breast exam every month is important for early detection of breast cancer.  For more information and instruction on self breast exams,  ask your healthcare provider or [place].eswww.womenshealth.gov/faq/breast-self-exam.cfm. 4. Eat a healthy diet- Eat a variety of healthy foods such as fruits, vegetables, whole grains, low fat milk, low fat cheeses, yogurt, lean meats, poultry and fish, beans, nuts, tofu, etc.  For more information go to www. Thenutritionsource.org 5. Drink alcohol in moderation- Limit alcohol intake to one drink or less per day. Never drink and drive. 6. Depression- Your emotional health is as important as your physical health.  If you're feeling down or losing interest in things you normally enjoy please talk to your healthcare provider about being screened for depression. 7. Dental visit- Brush and floss your teeth twice daily; visit your dentist twice a year. 8. Eye doctor- Get an eye exam at least every 2 years. 9. Helmet use- Always wear a helmet when riding a bicycle, motorcycle, rollerblading or skateboarding. 10. Safe sex- If you may be exposed to sexually transmitted infections, use a condom. 11. Seat belts- Seat belts can save your live; always wear one. 12. Smoke/Carbon Monoxide detectors- These detectors need to be installed on the appropriate level of your home. Replace batteries at least once a year. 13. Skin cancer- When out in the sun please cover up and use sunscreen 15 SPF or higher. 14. Violence- If anyone is threatening or hurting you, please tell your healthcare provider.

## 2014-01-22 NOTE — Progress Notes (Signed)
  Subjective:     Chelsey Guzman is a 33 y.o. female and is here for a comprehensive physical exam. The patient reports problems - pt already had labs drawn and platelets were decreased. she does admit to easy bruising. she also concerned about weight gain. She cannot seem to lose weight. She has tried phentermine and higher doses caused side effects and lower dose did not help her appetite. she denies any exercise. [name].  History   Social History  . Marital Status: Single    Spouse Name: N/A    Number of Children: N/A  . Years of Education: N/A   Occupational History  .  [employer]   Social History Main Topics  . Smoking status: Never Smoker   . Smokeless tobacco: Never Used  . Alcohol Use: No  . Drug Use: No  . Sexual Activity: Yes    Partners: Male   Other Topics Concern  . Not on file   Social History Narrative  . No narrative on file   Health Maintenance  Topic Date Due  . Influenza Vaccine  04/23/2014 (Originally 06/22/2013)  . Pap Smear  01/09/2015  . Tetanus/tdap  01/09/2022    The following portions of the patient's history were reviewed and updated as appropriate: allergies, current medications, past family history, past medical history, past social history, past surgical history and problem list.  Review of Systems A comprehensive review of systems was negative.   Objective:    BP 106/66  Pulse 99  Wt 192 lb (87.091 kg) General appearance: alert, cooperative, appears stated age and mildly obese Head: Normocephalic, without obvious abnormality, atraumatic Eyes: conjunctivae/corneas clear. PERRL, EOM's intact. Fundi benign. Ears: normal TM's and external ear canals both ears Nose: Nares normal. Septum midline. Mucosa normal. No drainage or sinus tenderness. Throat: lips, mucosa, and tongue normal; teeth and gums normal Neck: no adenopathy, no carotid bruit, no JVD, supple, symmetrical, trachea midline and thyroid not enlarged, symmetric, no  tenderness/mass/nodules Back: symmetric, no curvature. ROM normal. No CVA tenderness. Lungs: clear to auscultation bilaterally Heart: regular rate and rhythm, S1, S2 normal, no murmur, click, rub or gallop Abdomen: soft, non-tender; bowel sounds normal; no masses,  no organomegaly Extremities: extremities normal, atraumatic, no cyanosis or edema Pulses: 2+ and symmetric Skin: Skin color, texture, turgor normal. No rashes or lesions or brusing randomly over arms and legs.  Lymph nodes: Cervical, supraclavicular, and axillary nodes normal. Neurologic: Grossly normal    Assessment:    Healthy female exam.      Plan:    CPE- fasting labs discussed at today's visit. Platelets were decreased and pt reports brusing easily. Not taking any medications that should affect platelets. Denies any ASA/NSAIDs or supplements. Will recheck in 2 weeks. Pap is due next week and has appt. Has a hx of abnormal pap smears. Tdap up todate.  Recommended calcium 1200mg  and vitamin D 800 units daily.   Abnormal weight gain- encouraged patient to exercise regularly 150 minutes weekly. Discussed nutritionist. Mentioned belviq, qsymia or contrave to start for long term weigh management. Pt declines any treatment today. Will follow up as needed.   Depression screening was 0/2 negative.   See After Visit Summary for Counseling Recommendations

## 2014-02-04 LAB — CBC WITH DIFFERENTIAL/PLATELET
Basophils Absolute: 0.1 10*3/uL (ref 0.0–0.1)
Basophils Relative: 1 % (ref 0–1)
Eosinophils Absolute: 0.1 10*3/uL (ref 0.0–0.7)
Eosinophils Relative: 1 % (ref 0–5)
HCT: 38.3 % (ref 36.0–46.0)
Hemoglobin: 12.8 g/dL (ref 12.0–15.0)
Lymphocytes Relative: 36 % (ref 12–46)
Lymphs Abs: 2.1 10*3/uL (ref 0.7–4.0)
MCH: 28.7 pg (ref 26.0–34.0)
MCHC: 33.4 g/dL (ref 30.0–36.0)
MCV: 85.9 fL (ref 78.0–100.0)
Monocytes Absolute: 0.5 10*3/uL (ref 0.1–1.0)
Monocytes Relative: 8 % (ref 3–12)
Neutro Abs: 3.1 10*3/uL (ref 1.7–7.7)
Neutrophils Relative %: 54 % (ref 43–77)
Platelets: 102 10*3/uL — ABNORMAL LOW (ref 150–400)
RBC: 4.46 MIL/uL (ref 3.87–5.11)
RDW: 13.5 % (ref 11.5–15.5)
WBC: 5.7 10*3/uL (ref 4.0–10.5)

## 2014-02-05 ENCOUNTER — Other Ambulatory Visit: Payer: Self-pay | Admitting: Physician Assistant

## 2014-02-05 DIAGNOSIS — D696 Thrombocytopenia, unspecified: Secondary | ICD-10-CM

## 2014-02-28 ENCOUNTER — Telehealth: Payer: Self-pay | Admitting: Hematology & Oncology

## 2014-02-28 NOTE — Telephone Encounter (Signed)
Spoke w NEW PATIENT today to remind them of their appointment with Dr. Ennever. Also, advised them to bring all meds and insurance information. ° °

## 2014-03-01 ENCOUNTER — Encounter: Payer: Self-pay | Admitting: Hematology & Oncology

## 2014-03-01 ENCOUNTER — Ambulatory Visit: Payer: BC Managed Care – PPO

## 2014-03-01 ENCOUNTER — Ambulatory Visit (HOSPITAL_BASED_OUTPATIENT_CLINIC_OR_DEPARTMENT_OTHER): Payer: BC Managed Care – PPO | Admitting: Hematology & Oncology

## 2014-03-01 ENCOUNTER — Other Ambulatory Visit (HOSPITAL_BASED_OUTPATIENT_CLINIC_OR_DEPARTMENT_OTHER): Payer: BC Managed Care – PPO | Admitting: Lab

## 2014-03-01 VITALS — BP 113/73 | HR 87 | Temp 97.8°F | Resp 14 | Ht 65.0 in | Wt 187.0 lb

## 2014-03-01 DIAGNOSIS — D696 Thrombocytopenia, unspecified: Secondary | ICD-10-CM

## 2014-03-01 LAB — CBC WITH DIFFERENTIAL (CANCER CENTER ONLY)
BASO#: 0 10*3/uL (ref 0.0–0.2)
BASO%: 0.4 % (ref 0.0–2.0)
EOS%: 0.8 % (ref 0.0–7.0)
Eosinophils Absolute: 0 10*3/uL (ref 0.0–0.5)
HCT: 39.4 % (ref 34.8–46.6)
HGB: 13.2 g/dL (ref 11.6–15.9)
LYMPH#: 1.8 10*3/uL (ref 0.9–3.3)
LYMPH%: 34.7 % (ref 14.0–48.0)
MCH: 29.7 pg (ref 26.0–34.0)
MCHC: 33.5 g/dL (ref 32.0–36.0)
MCV: 89 fL (ref 81–101)
MONO#: 0.5 10*3/uL (ref 0.1–0.9)
MONO%: 8.9 % (ref 0.0–13.0)
NEUT#: 2.8 10*3/uL (ref 1.5–6.5)
NEUT%: 55.2 % (ref 39.6–80.0)
Platelets: 103 10*3/uL — ABNORMAL LOW (ref 145–400)
RBC: 4.44 10*6/uL (ref 3.70–5.32)
RDW: 11.9 % (ref 11.1–15.7)
WBC: 5 10*3/uL (ref 3.9–10.0)

## 2014-03-01 LAB — CHCC SATELLITE - SMEAR

## 2014-03-01 LAB — TECHNOLOGIST REVIEW CHCC SATELLITE

## 2014-03-01 NOTE — Progress Notes (Signed)
Referral MD  Reason for Referral: Thrombocytopenia   Chief Complaint  Patient presents with  . NEW PATIENT    dizziness, fatigue and low platelet count.  : My platelets are low. I also have dizziness and headaches.  HPI: [name] is very charming 33 year old Afro-American female. She is originally from [place]. She's been down here in for about 13 years.  She has been having some problems with headache. There's been some dizziness. This been going on for the past couple weeks.  She had routine lab work done back in February. This showed a platelet count of 126,000. To cover years before that her platelet count was normal at 230,000.  She had a normal white cell count. She is not anemic.  She says that she's had some bruises lately. She showed me a picture of a 1 on her cell phone. These seem to be on her legs. She's had no bleeding. She has had normal monthly cycles. She's had no bleeding with past surgery. She her tonsils out 2 years ago.  Is no bleeding in the family. There is no blood problems in the family. There is no history of sickle cell.  She's not in vegetarian. Her appetite has been good. She's had no weight loss weight gain.  She has a thyroid goiter.  There's been no rashes. She's had no joint issues. She's had no problems with miscarriages. She has 2 children. The pregnancies were not complicated.  We were asked to see her to help with the thrombocytopenia evaluation.   Past Medical History  Diagnosis Date  . Asthma   . Abnormal Pap smear     leep  . Thyroid enlargement 01/10/2012  . Acne 01/10/2012  :  Past Surgical History  Procedure Laterality Date  . Btl  2009  :  Current outpatient prescriptions:albuterol (PROVENTIL HFA;VENTOLIN HFA) 108 (90 BASE) MCG/ACT inhaler, Inhale 2 puffs into the lungs every 6 (six) hours as needed for wheezing or shortness of breath., Disp: 1 Inhaler, Rfl: 0:  :  No Known Allergies:  Family History  Problem Relation  Age of Onset  . Diabetes Sister   :  History   Social History  . Marital Status: Single    Spouse Name: N/A    Number of Children: N/A  . Years of Education: N/A   Occupational History  .  [employer]   Social History Main Topics  . Smoking status: Never Smoker   . Smokeless tobacco: Never Used     Comment: never used tobacco  . Alcohol Use: No  . Drug Use: No  . Sexual Activity: Yes    Partners: Male   Other Topics Concern  . Not on file   Social History Narrative  . No narrative on file  :  Pertinent items are noted in HPI.  Exam: @IPVITALS @  well-developed well-nourished Afro-American female in no obvious distress. Her vital signs show a temperature of 97.9. Pulse 87. Weight 187 pounds. Blood pressure 113/73. Head and exam shows a normocephalic atraumatic skull. She has no ocular or oral lesions. She has no nystagmus. She does have a thyroid enlargement. No adenopathy noted in her neck. Lungs are clear. Cardiac exam regular in rhythm with no murmurs rubs or bruits. Abdomen is soft. She has good bowel sounds. There is no fluid wave. There is no palpable liver or spleen tip. Back exam no tenderness over the spine ribs or hips. Extremities shows no clubbing cyanosis or edema. Has good range  of motion of her joints. No joint swelling or erythema is noted. She has good strength in her upper and lower extremity. Skin exam shows no obvious rashes, ecchymoses or petechia. Neurological exam shows no focal neurological deficits. Cranial nerves are intact.    Recent Labs  03/01/14 0830  WBC 5.0  HGB 13.2  HCT 39.4  PLT 103*   No results found for this basename: NA, K, CL, CO2, GLUCOSE, BUN, CREATININE, CALCIUM,  in the last 72 hours  Blood smear review: Normochromic and normocytic red blood cells. There are no schistocytes. There are no spherocytes. There is no rouleau formation. I see no target cells. She has no nucleated red cells or teardrop cells. White cells per  normal in morphology maturation. No hyper to menopause are noted. There is no immature myeloid lymphoid cells. Platelets are minimally decreased number. They are well granulated. She is several large platelets.  Pathology: None     Assessment and Plan: [name] is a very charming 33 year old African American female. She has mild thrombocytopenia. It is holding stable since March. I don't see anything that would suggest an underlying bone marrow disorder such as myelo dysplasia or leukemia/myeloma/lymphoma. There is nothing to suggest vitamin B12 deficiency  Her blood smear and was pretty much unremarkable. 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 She's on no medications that might cause thrombocytopenia. She does not take any kind supplements. There is no risk factors for hepatitis or HIV. She's not had any blood transfusions. I see no evidence to support cirrhosis.  Being that she is young and [place], one might make a case for a collagen vascular disease. Lupus certainly can be a possible although she does not have any other criteria for lupus. However, thrombocytopenia can predate lupus by several years.  I don't see anything that was just anti-phospholipid antibody syndrome. I know that headaches can occur with this. However, I would think that there be more clinical signs. She's not had any kind of blood clot issues. She's had no miscarriages.  I don't think this is von Willebrand disease. However this is a possibility that needs to be considered.  I really cannot relate the thrombocytopenia to the headaches. The thrombocytopenia is just not that bad so she should not have any issues with bleeding.  Past medical out with her and her boyfriend. They're both very nice. It was nice to talk with him. I went over the lab work. I gave her a prayer blanket.  I will I see her back in 2 months.  I want to see her back in

## 2014-03-04 ENCOUNTER — Telehealth: Payer: Self-pay | Admitting: Nurse Practitioner

## 2014-03-04 NOTE — Telephone Encounter (Addendum)
Message copied by Glee ArvinPICKENPACK-COUSAR, Davey Bergsma N on Mon Mar 04, 2014 11:03 AM ------      Message from: Josph MachoENNEVER, PETER R      Created: Sun Mar 03, 2014  8:34 PM       Call - labs so far are ok!!  Cindee LamePete ------Pt verbalized understanding and appreciation.

## 2014-03-05 LAB — HEPATITIS PANEL, ACUTE
HCV Ab: NEGATIVE
Hep A IgM: NONREACTIVE
Hep B C IgM: NONREACTIVE
Hepatitis B Surface Ag: NEGATIVE

## 2014-03-05 LAB — ANA: Anti Nuclear Antibody(ANA): NEGATIVE

## 2014-03-05 LAB — RHEUMATOID FACTOR: Rhuematoid fact SerPl-aCnc: <10 IU/mL (ref <=14)

## 2014-03-05 LAB — HIV-1 RNA QUANT-NO REFLEX-BLD
HIV 1 RNA Quant: <20 copies/mL (ref <20)
HIV-1 RNA Quant, Log: <1.3 {Log} (ref <1.30)

## 2014-05-01 ENCOUNTER — Encounter: Payer: Self-pay | Admitting: Hematology & Oncology

## 2014-05-01 ENCOUNTER — Ambulatory Visit: Payer: BC Managed Care – PPO | Admitting: Hematology & Oncology

## 2014-05-01 ENCOUNTER — Other Ambulatory Visit (HOSPITAL_BASED_OUTPATIENT_CLINIC_OR_DEPARTMENT_OTHER): Payer: BC Managed Care – PPO | Admitting: Lab

## 2014-05-01 VITALS — BP 121/81 | HR 80 | Temp 98.1°F | Resp 14 | Ht 65.0 in | Wt 196.0 lb

## 2014-05-01 DIAGNOSIS — E049 Nontoxic goiter, unspecified: Secondary | ICD-10-CM

## 2014-05-01 DIAGNOSIS — D699 Hemorrhagic condition, unspecified: Secondary | ICD-10-CM

## 2014-05-01 DIAGNOSIS — D696 Thrombocytopenia, unspecified: Secondary | ICD-10-CM

## 2014-05-01 LAB — CBC WITH DIFFERENTIAL (CANCER CENTER ONLY)
BASO#: 0 10*3/uL (ref 0.0–0.2)
BASO%: 0.2 % (ref 0.0–2.0)
EOS%: 1.5 % (ref 0.0–7.0)
Eosinophils Absolute: 0.1 10*3/uL (ref 0.0–0.5)
HCT: 40.9 % (ref 34.8–46.6)
HGB: 13.5 g/dL (ref 11.6–15.9)
LYMPH#: 1.9 10*3/uL (ref 0.9–3.3)
LYMPH%: 34 % (ref 14.0–48.0)
MCH: 29.4 pg (ref 26.0–34.0)
MCHC: 33 g/dL (ref 32.0–36.0)
MCV: 89 fL (ref 81–101)
MONO#: 0.4 10*3/uL (ref 0.1–0.9)
MONO%: 7.5 % (ref 0.0–13.0)
NEUT#: 3.1 10*3/uL (ref 1.5–6.5)
NEUT%: 56.8 % (ref 39.6–80.0)
Platelets: 123 10*3/uL — ABNORMAL LOW (ref 145–400)
RBC: 4.59 10*6/uL (ref 3.70–5.32)
RDW: 12.3 % (ref 11.1–15.7)
WBC: 5.5 10*3/uL (ref 3.9–10.0)

## 2014-05-01 LAB — TECHNOLOGIST REVIEW CHCC SATELLITE

## 2014-05-01 LAB — CHCC SATELLITE - SMEAR

## 2014-05-01 LAB — PROTHROMBIN TIME
INR: 1.06 (ref <1.50)
Prothrombin Time: 13.7 seconds (ref 11.6–15.2)

## 2014-05-01 LAB — APTT: aPTT: 31 seconds (ref 24–37)

## 2014-05-01 LAB — TSH CHCC: TSH: 1.979 m(IU)/L (ref 0.308–3.960)

## 2014-05-03 LAB — VON WILLEBRAND PANEL
Coagulation Factor VIII: 118 % (ref 73–140)
Ristocetin Co-factor, Plasma: 98 % (ref 42–200)
Von Willebrand Antigen, Plasma: 102 % (ref 50–217)

## 2014-05-03 LAB — CARDIOLIPIN ANTIBODIES, IGG, IGM, IGA
Anticardiolipin IgA: 0 APL U/mL (ref <22)
Anticardiolipin IgG: 5 GPL U/mL (ref <23)
Anticardiolipin IgM: 1 MPL U/mL (ref <11)

## 2014-05-10 ENCOUNTER — Telehealth: Payer: Self-pay | Admitting: Hematology & Oncology

## 2014-05-10 NOTE — Telephone Encounter (Signed)
Pt called said no one has called her for lab results and follow up appointment. I transferred call to RN

## 2014-05-16 ENCOUNTER — Other Ambulatory Visit (HOSPITAL_BASED_OUTPATIENT_CLINIC_OR_DEPARTMENT_OTHER): Payer: BC Managed Care – PPO | Admitting: Lab

## 2014-05-16 DIAGNOSIS — D699 Hemorrhagic condition, unspecified: Secondary | ICD-10-CM

## 2014-05-16 DIAGNOSIS — E049 Nontoxic goiter, unspecified: Secondary | ICD-10-CM

## 2014-05-16 DIAGNOSIS — D696 Thrombocytopenia, unspecified: Secondary | ICD-10-CM

## 2014-05-16 LAB — CBC WITH DIFFERENTIAL (CANCER CENTER ONLY)
BASO#: 0 10*3/uL (ref 0.0–0.2)
BASO%: 0.3 % (ref 0.0–2.0)
EOS%: 1.1 % (ref 0.0–7.0)
Eosinophils Absolute: 0.1 10*3/uL (ref 0.0–0.5)
HCT: 40.3 % (ref 34.8–46.6)
HGB: 13.4 g/dL (ref 11.6–15.9)
LYMPH#: 2.4 10*3/uL (ref 0.9–3.3)
LYMPH%: 34.2 % (ref 14.0–48.0)
MCH: 29.4 pg (ref 26.0–34.0)
MCHC: 33.3 g/dL (ref 32.0–36.0)
MCV: 88 fL (ref 81–101)
MONO#: 0.5 10*3/uL (ref 0.1–0.9)
MONO%: 7.3 % (ref 0.0–13.0)
NEUT#: 4 10*3/uL (ref 1.5–6.5)
NEUT%: 57.1 % (ref 39.6–80.0)
Platelets: 128 10*3/uL — ABNORMAL LOW (ref 145–400)
RBC: 4.56 10*6/uL (ref 3.70–5.32)
RDW: 12.7 % (ref 11.1–15.7)
WBC: 7 10*3/uL (ref 3.9–10.0)

## 2014-05-16 LAB — PLATELET FUNCTION ASSAY: Collagen / Epinephrine: 140 seconds (ref 0–184)

## 2014-06-05 ENCOUNTER — Telehealth: Payer: Self-pay | Admitting: *Deleted

## 2014-06-05 NOTE — Telephone Encounter (Addendum)
Message copied by Burnett CorrenteUMLEY, Ezrie Bunyan L on Wed Jun 05, 2014 12:28 PM ------      Message from: Arlan OrganENNEVER, PETER R      Created: Wed Jun 05, 2014  7:15 AM       Call - the platelet function studies are normal. i need to see her back in 2-3 weeks.  Make an appt for me to include labwork.  pete ------Called pt and informed her that plt studies were normal. Informed her about app in 2-3 weeks with lab and MD. Set up pt with scheduler.

## 2014-06-20 ENCOUNTER — Other Ambulatory Visit: Payer: Self-pay | Admitting: *Deleted

## 2014-06-20 DIAGNOSIS — E049 Nontoxic goiter, unspecified: Secondary | ICD-10-CM

## 2014-06-21 ENCOUNTER — Ambulatory Visit (HOSPITAL_BASED_OUTPATIENT_CLINIC_OR_DEPARTMENT_OTHER): Payer: BC Managed Care – PPO | Admitting: Hematology & Oncology

## 2014-06-21 ENCOUNTER — Other Ambulatory Visit (HOSPITAL_BASED_OUTPATIENT_CLINIC_OR_DEPARTMENT_OTHER): Payer: BC Managed Care – PPO | Admitting: Lab

## 2014-06-21 ENCOUNTER — Encounter: Payer: Self-pay | Admitting: Hematology & Oncology

## 2014-06-21 ENCOUNTER — Ambulatory Visit (HOSPITAL_BASED_OUTPATIENT_CLINIC_OR_DEPARTMENT_OTHER)
Admission: RE | Admit: 2014-06-21 | Discharge: 2014-06-21 | Disposition: A | Discharge status: Home / Self Care | Payer: BC Managed Care – PPO | Source: Ambulatory Visit | Attending: Hematology & Oncology | Admitting: Hematology & Oncology

## 2014-06-21 VITALS — BP 122/78 | HR 75 | Temp 97.5°F | Resp 14 | Ht 65.0 in | Wt 202.0 lb

## 2014-06-21 DIAGNOSIS — R042 Hemoptysis: Secondary | ICD-10-CM | POA: Insufficient documentation

## 2014-06-21 DIAGNOSIS — E049 Nontoxic goiter, unspecified: Secondary | ICD-10-CM

## 2014-06-21 DIAGNOSIS — D696 Thrombocytopenia, unspecified: Secondary | ICD-10-CM

## 2014-06-21 DIAGNOSIS — D693 Immune thrombocytopenic purpura: Secondary | ICD-10-CM

## 2014-06-21 DIAGNOSIS — R58 Hemorrhage, not elsewhere classified: Secondary | ICD-10-CM

## 2014-06-21 DIAGNOSIS — J45909 Unspecified asthma, uncomplicated: Secondary | ICD-10-CM | POA: Insufficient documentation

## 2014-06-21 LAB — CBC WITH DIFFERENTIAL (CANCER CENTER ONLY)
BASO#: 0 10*3/uL (ref 0.0–0.2)
BASO%: 0.5 % (ref 0.0–2.0)
EOS%: 1.6 % (ref 0.0–7.0)
Eosinophils Absolute: 0.1 10*3/uL (ref 0.0–0.5)
HCT: 40.1 % (ref 34.8–46.6)
HGB: 13.3 g/dL (ref 11.6–15.9)
LYMPH#: 2.2 10*3/uL (ref 0.9–3.3)
LYMPH%: 34.5 % (ref 14.0–48.0)
MCH: 29.8 pg (ref 26.0–34.0)
MCHC: 33.2 g/dL (ref 32.0–36.0)
MCV: 90 fL (ref 81–101)
MONO#: 0.5 10*3/uL (ref 0.1–0.9)
MONO%: 8.2 % (ref 0.0–13.0)
NEUT#: 3.5 10*3/uL (ref 1.5–6.5)
NEUT%: 55.2 % (ref 39.6–80.0)
Platelets: 117 10*3/uL — ABNORMAL LOW (ref 145–400)
RBC: 4.47 10*6/uL (ref 3.70–5.32)
RDW: 12.5 % (ref 11.1–15.7)
WBC: 6.3 10*3/uL (ref 3.9–10.0)

## 2014-06-21 LAB — CHCC SATELLITE - SMEAR

## 2014-06-21 MED ORDER — VITAMIN K (PHYTONADIONE) 100 MCG PO TABS: 1.0000 | ORAL_TABLET | Freq: Every day | ORAL | Status: DC

## 2014-06-21 NOTE — Progress Notes (Signed)
Referral MD  Reason for Referral: Thrombocytopenia and bruising   Chief Complaint  Patient presents with  . 6t6tfeeling tired all the time  : I have bruising  HPI: [name] is a 33 year old African American female. We actually saw her in about 6 weeks ago.   Her platelet counts have all been mildly depressed. When we saw her initially her platelet count was 123,000.  She's tested negative for hepatitis B and hepatitis C. She's been tested negative for HIV.  We checked her for followup and studies. Von Willebrand titers were normal. We checked the pro time and PTT. These were both normal. We did anti-cardiolipin antibody titers. These also were normal. Her TSH was normal at 1.9.  We did platelet function studies. These were normal.  She still has some ecchymoses. They're not that large. Her she's had no bleeding. She's had some slightly heavy cycles. She never has been anemic.         Past Medical History  Diagnosis Date  . Asthma   . Abnormal Pap smear     leep  . Thyroid enlargement 01/10/2012  . Acne 01/10/2012  :  Past Surgical History  Procedure Laterality Date  . Btl  2009  :  Current outpatient prescriptions:albuterol (PROVENTIL HFA;VENTOLIN HFA) 108 (90 BASE) MCG/ACT inhaler, Inhale 2 puffs into the lungs every 6 (six) hours as needed for wheezing or shortness of breath., Disp: 1 Inhaler, Rfl: 0;  Vitamin K, Phytonadione, 100 MCG TABS, Take 1 tablet (100 mcg total) by mouth daily., Disp: 30 tablet, Rfl: 4:  :  No Known Allergies:  Family History  Problem Relation Age of Onset  . Diabetes Sister   :  History   Social History  . Marital Status: Single    Spouse Name: N/A    Number of Children: N/A  . Years of Education: N/A   Occupational History  .  [employer]   Social History Main Topics  . Smoking status: Never Smoker   . Smokeless tobacco: Never Used     Comment: never used tobacco  . Alcohol Use: No  . Drug Use: No  . Sexual  Activity: Yes    Partners: Male   Other Topics Concern  . Not on file   Social History Narrative  . No narrative on file  :  Pertinent items are noted in HPI.  Exam: '@IPVITALS' @  well-developed and well-nourished African [place] female in no obvious distress. Her vital signs show temperature   of 97.6. Pulse 75. Blood pressure 122/79. Weight is 202 pounds. Head and neck exam shows no ocular or oral lesions. There are no palpable cervical or supraclavicular lymph nodes. Lungs are clear along. Cardiac exam regular in rhythm. Abdomen soft. There is no fluid wave. There is no palpable liver or spleen tip. Extremities shows no clubbing cyanosis or edema. Skin exam shows scattered ecchymoses. These are small. There are few in number. They are not palpable.   Recent Labs  06/21/14 1338  WBC 6.3  HGB 13.3  HCT 40.1  PLT 117 Platelet count consistent in citrate*   No results found for this basename: NA, K, CL, CO2, GLUCOSE, BUN, CREATININE, CALCIUM,  in the last 72 hours  Blood smear review: Platelets are minimally decreased in number. She has several large platelets. Platelets are well regulated. White cells or red cells are normal in morphology maturation.  Pathology: None    Assessment and Plan: We will go ahead and get her set up  with a ultrasound of the abdomen. I want to make sure that there is no splenomegaly. I cannot imagine why she would have any kind of liver problem.  We would next consider a bone marrow biopsy on her. I can't think of any other blood work that we can do to try to help explain the thrombocytopenia. 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  I really can't explain the bruising. These do not seem to be all that significant. However, she is bothered by this and that is what is important.  I will check a vitamin K. level on her. I put her on a low dose of vitamin K.  Another option would be to refer her to a academic  center.  She does not appear to have a lot of symptoms or signs that would suggest a vascular issue. I don't think she has a can of vasculitis.  We will see what the abdominal ultrasound shows. I will then see as to when to get her back.

## 2014-06-23 ENCOUNTER — Ambulatory Visit (HOSPITAL_BASED_OUTPATIENT_CLINIC_OR_DEPARTMENT_OTHER)
Admission: RE | Admit: 2014-06-23 | Discharge: 2014-06-23 | Disposition: A | Discharge status: Home / Self Care | Payer: BC Managed Care – PPO | Source: Ambulatory Visit | Attending: Hematology & Oncology | Admitting: Hematology & Oncology

## 2014-06-23 DIAGNOSIS — D693 Immune thrombocytopenic purpura: Secondary | ICD-10-CM | POA: Insufficient documentation

## 2014-06-23 DIAGNOSIS — R58 Hemorrhage, not elsewhere classified: Secondary | ICD-10-CM

## 2014-06-24 ENCOUNTER — Other Ambulatory Visit (HOSPITAL_BASED_OUTPATIENT_CLINIC_OR_DEPARTMENT_OTHER): Payer: BC Managed Care – PPO

## 2014-06-25 ENCOUNTER — Encounter: Payer: Self-pay | Admitting: *Deleted

## 2014-06-26 ENCOUNTER — Other Ambulatory Visit: Payer: BC Managed Care – PPO

## 2014-06-27 LAB — PROTHROMBIN TIME
INR: 1.08 (ref <1.50)
Prothrombin Time: 14 seconds (ref 11.6–15.2)

## 2014-06-27 LAB — APTT: aPTT: 41 seconds — ABNORMAL HIGH (ref 24–37)

## 2014-06-27 LAB — VITAMIN K1, SERUM: Vitamin K: 273 pg/mL (ref 80–1160)

## 2014-07-10 ENCOUNTER — Encounter: Payer: Self-pay | Admitting: Hematology & Oncology

## 2014-07-10 NOTE — Progress Notes (Signed)
dictated

## 2014-07-17 ENCOUNTER — Telehealth: Payer: Self-pay | Admitting: *Deleted

## 2014-07-17 NOTE — Telephone Encounter (Signed)
Made referral to Dr. Dorothy Spark at [facility] per dr. Myna Hidalgo request.  They will send patient a packet and appointment in the am

## 2014-08-03 LAB — CBC AND DIFFERENTIAL
HCT: 40 % (ref 36–46)
Hemoglobin: 13 g/dL (ref 12.0–16.0)
Platelets: 154 10*3/uL (ref 150–399)
WBC: 7.6 10^3/mL

## 2014-08-03 LAB — TSH: TSH: 2.46 u[IU]/mL (ref 0.41–5.90)

## 2014-08-14 ENCOUNTER — Encounter: Payer: Self-pay | Admitting: Physician Assistant

## 2014-08-14 ENCOUNTER — Ambulatory Visit (INDEPENDENT_AMBULATORY_CARE_PROVIDER_SITE_OTHER): Payer: BC Managed Care – PPO | Admitting: Physician Assistant

## 2014-08-14 VITALS — BP 109/67 | HR 53 | Ht 65.0 in | Wt 206.0 lb

## 2014-08-14 DIAGNOSIS — R635 Abnormal weight gain: Secondary | ICD-10-CM

## 2014-08-14 DIAGNOSIS — R1032 Left lower quadrant pain: Secondary | ICD-10-CM

## 2014-08-14 DIAGNOSIS — R300 Dysuria: Secondary | ICD-10-CM

## 2014-08-14 DIAGNOSIS — R319 Hematuria, unspecified: Secondary | ICD-10-CM | POA: Diagnosis not present

## 2014-08-14 DIAGNOSIS — N3001 Acute cystitis with hematuria: Secondary | ICD-10-CM

## 2014-08-14 DIAGNOSIS — N3000 Acute cystitis without hematuria: Secondary | ICD-10-CM

## 2014-08-14 LAB — POCT URINALYSIS DIPSTICK
Bilirubin, UA: NEGATIVE
Glucose, UA: NEGATIVE
Ketones, UA: NEGATIVE
Nitrite, UA: NEGATIVE
Protein, UA: 100
Spec Grav, UA: 1.015
Urobilinogen, UA: 0.2
pH, UA: 6

## 2014-08-14 MED ORDER — CIPROFLOXACIN HCL 500 MG PO TABS: 500.0000 mg | ORAL_TABLET | Freq: Two times a day (BID) | ORAL | Status: DC

## 2014-08-14 MED ORDER — PHENTERMINE HCL 37.5 MG PO TABS: 37.5000 mg | ORAL_TABLET | Freq: Every day | ORAL | Status: DC

## 2014-08-14 NOTE — Patient Instructions (Signed)

## 2014-08-14 NOTE — Progress Notes (Signed)
   Subjective:    Patient ID: Chelsey Guzman, female    DOB: October 03, 1981, 33 y.o.   MRN: [id number]  HPI Pt presents to the clinic with dysuria, lower left abdominal pain, frequent urination for 4 days. No fever, chills, n/v/d. Tried AZO and got better for 2 days then symptoms have come back.   Pt would also like to go back on phentermine for weight loss.   Review of Systems  All other systems reviewed and are negative.      Objective:   Physical Exam  Constitutional: She is oriented to person, place, and time. She appears well-developed and well-nourished.  Obesity.   HENT:  Head: Normocephalic and atraumatic.  Cardiovascular: Normal rate, regular rhythm and normal heart sounds.   Pulmonary/Chest: Effort normal and breath sounds normal.  No CVA tenderness.   Abdominal: Soft. Bowel sounds are normal.  Tenderness over lower left quadrant to palpation. No guarding or rebound.   Neurological: She is alert and oriented to person, place, and time.  Skin: Skin is dry.  Psychiatric: She has a normal mood and affect. Her behavior is normal.          Assessment & Plan:  UTI- .[name] Results for orders placed in visit on 08/14/14  POCT URINALYSIS DIPSTICK      Result Value Ref Range   Color, UA yellow     Clarity, UA cloudy     Glucose, UA neg     Bilirubin, UA neg     Ketones, UA neg     Spec Grav, UA 1.015     Blood, UA moderate     pH, UA 6.0     Protein, UA 100     Urobilinogen, UA 0.2     Nitrite, UA neg     Leukocytes, UA large (3+)     Treated with cipro for 3 days. HO given. Stay hydrated. Call if not improving  Or if worsening.   Abnormal weight gain- restarted phentermine. Discussed side effects. Pt aware consistent diet and exercise while on medication is the best way to lose significant weight. She is not currently exercise. initated exercise goal per week. Follow up in 1 months.

## 2014-09-16 ENCOUNTER — Ambulatory Visit (INDEPENDENT_AMBULATORY_CARE_PROVIDER_SITE_OTHER): Payer: BC Managed Care – PPO | Admitting: Physician Assistant

## 2014-09-16 VITALS — BP 112/73 | HR 82 | Wt 195.0 lb

## 2014-09-16 DIAGNOSIS — E669 Obesity, unspecified: Secondary | ICD-10-CM

## 2014-09-16 DIAGNOSIS — R635 Abnormal weight gain: Secondary | ICD-10-CM

## 2014-09-16 MED ORDER — PHENTERMINE HCL 37.5 MG PO TABS: 37.5000 mg | ORAL_TABLET | Freq: Every day | ORAL | Status: DC

## 2014-09-16 NOTE — Progress Notes (Signed)
   Subjective:    Patient ID: Chelsey Guzman, female    DOB: [birth year], 33 y.o.   MRN: [id number]  HPI  Chelsey Guzman is here for a weight and blood pressure check. Denies chest pain, shortness of breath or headaches.   Review of Systems     Objective:   Physical Exam        Assessment & Plan:  Patient has lost weight. [facility] [address] [facility] [place].

## 2014-09-23 ENCOUNTER — Encounter: Payer: Self-pay | Admitting: Physician Assistant

## 2014-10-16 ENCOUNTER — Ambulatory Visit (INDEPENDENT_AMBULATORY_CARE_PROVIDER_SITE_OTHER): Payer: BC Managed Care – PPO | Admitting: Physician Assistant

## 2014-10-16 VITALS — BP 103/68 | HR 96 | Wt 192.0 lb

## 2014-10-16 DIAGNOSIS — R635 Abnormal weight gain: Secondary | ICD-10-CM

## 2014-10-16 MED ORDER — PHENTERMINE HCL 37.5 MG PO TABS: 37.5000 mg | ORAL_TABLET | Freq: Every day | ORAL | Status: DC

## 2014-10-16 MED ORDER — ALBUTEROL SULFATE 0.63 MG/3ML IN NEBU: 1.0000 | INHALATION_SOLUTION | Freq: Four times a day (QID) | RESPIRATORY_TRACT | Status: DC | PRN

## 2014-10-16 NOTE — Progress Notes (Signed)
Patient doing well on appetite suppressant. Here for nurse visit, weight, BP, HR check. Denies problems with insomnia, heart palpitations or tremors. Satisfied with weight loss thus far and is working on healthy diet and regular exercise. .Kal Chait Lynetta   

## 2015-01-20 ENCOUNTER — Telehealth: Payer: Self-pay | Admitting: Physician Assistant

## 2015-01-20 ENCOUNTER — Telehealth: Payer: Self-pay | Admitting: *Deleted

## 2015-01-20 DIAGNOSIS — E039 Hypothyroidism, unspecified: Secondary | ICD-10-CM

## 2015-01-20 DIAGNOSIS — Z0000 Encounter for general adult medical examination without abnormal findings: Secondary | ICD-10-CM

## 2015-01-20 NOTE — Telephone Encounter (Signed)
Patient called scheduled a cpe for 01/27/15 at 1:15 and request to have a lab order sent downstairs this week so she can get labs done before her cpe. Pt req a call to know that labs were sent downstairs. Thanks

## 2015-01-20 NOTE — Telephone Encounter (Signed)
Labs ordered pt notified

## 2015-01-21 LAB — CBC WITH DIFFERENTIAL/PLATELET
Basophils Absolute: 0 10*3/uL (ref 0.0–0.1)
Basophils Relative: 0 % (ref 0–1)
Eosinophils Absolute: 0.1 10*3/uL (ref 0.0–0.7)
Eosinophils Relative: 2 % (ref 0–5)
HCT: 42.6 % (ref 36.0–46.0)
Hemoglobin: 14.1 g/dL (ref 12.0–15.0)
Lymphocytes Relative: 39 % (ref 12–46)
Lymphs Abs: 2.3 10*3/uL (ref 0.7–4.0)
MCH: 29.4 pg (ref 26.0–34.0)
MCHC: 33.1 g/dL (ref 30.0–36.0)
MCV: 88.9 fL (ref 78.0–100.0)
MPV: 11.9 fL (ref 8.6–12.4)
Monocytes Absolute: 0.5 10*3/uL (ref 0.1–1.0)
Monocytes Relative: 8 % (ref 3–12)
Neutro Abs: 3 10*3/uL (ref 1.7–7.7)
Neutrophils Relative %: 51 % (ref 43–77)
Platelets: 126 10*3/uL — ABNORMAL LOW (ref 150–400)
RBC: 4.79 MIL/uL (ref 3.87–5.11)
RDW: 13.2 % (ref 11.5–15.5)
WBC: 5.9 10*3/uL (ref 4.0–10.5)

## 2015-01-21 LAB — LIPID PANEL
Cholesterol: 238 mg/dL — ABNORMAL HIGH (ref 0–200)
HDL: 58 mg/dL (ref >=46)
LDL Cholesterol: 167 mg/dL — ABNORMAL HIGH (ref 0–99)
Total CHOL/HDL Ratio: 4.1 Ratio
Triglycerides: 63 mg/dL (ref <150)
VLDL: 13 mg/dL (ref 0–40)

## 2015-01-21 LAB — COMPLETE METABOLIC PANEL WITH GFR
ALT: 11 U/L (ref 0–35)
AST: 20 U/L (ref 0–37)
Albumin: 4.3 g/dL (ref 3.5–5.2)
Alkaline Phosphatase: 65 U/L (ref 39–117)
BUN: 13 mg/dL (ref 6–23)
CO2: 26 mEq/L (ref 19–32)
Calcium: 9.3 mg/dL (ref 8.4–10.5)
Chloride: 103 mEq/L (ref 96–112)
Creat: 0.77 mg/dL (ref 0.50–1.10)
GFR, Est African American: >89 mL/min
GFR, Est Non African American: >89 mL/min
Glucose, Bld: 88 mg/dL (ref 70–99)
Potassium: 4.2 mEq/L (ref 3.5–5.3)
Sodium: 139 mEq/L (ref 135–145)
Total Bilirubin: 0.5 mg/dL (ref 0.2–1.2)
Total Protein: 7.3 g/dL (ref 6.0–8.3)

## 2015-01-21 LAB — TSH: TSH: 2.179 u[IU]/mL (ref 0.350–4.500)

## 2015-01-22 ENCOUNTER — Encounter: Payer: Self-pay | Admitting: Physician Assistant

## 2015-01-22 DIAGNOSIS — E785 Hyperlipidemia, unspecified: Secondary | ICD-10-CM | POA: Insufficient documentation

## 2015-01-22 DIAGNOSIS — D696 Thrombocytopenia, unspecified: Secondary | ICD-10-CM | POA: Insufficient documentation

## 2015-01-27 ENCOUNTER — Encounter: Payer: Self-pay | Admitting: Physician Assistant

## 2015-01-27 ENCOUNTER — Ambulatory Visit (INDEPENDENT_AMBULATORY_CARE_PROVIDER_SITE_OTHER): Payer: BLUE CROSS/BLUE SHIELD | Admitting: Physician Assistant

## 2015-01-27 VITALS — BP 127/71 | HR 98 | Ht 65.0 in | Wt 198.0 lb

## 2015-01-27 DIAGNOSIS — D693 Immune thrombocytopenic purpura: Secondary | ICD-10-CM | POA: Insufficient documentation

## 2015-01-27 DIAGNOSIS — D696 Thrombocytopenia, unspecified: Secondary | ICD-10-CM | POA: Diagnosis not present

## 2015-01-27 DIAGNOSIS — E049 Nontoxic goiter, unspecified: Secondary | ICD-10-CM | POA: Diagnosis not present

## 2015-01-27 DIAGNOSIS — Z0000 Encounter for general adult medical examination without abnormal findings: Secondary | ICD-10-CM

## 2015-01-27 DIAGNOSIS — E785 Hyperlipidemia, unspecified: Secondary | ICD-10-CM

## 2015-01-27 NOTE — Patient Instructions (Addendum)
Aquafor Coconut oil Zyrtec daily.   Keeping You Healthy  Get These Tests 1. Blood Pressure- Have your blood pressure checked once a year by your health care provider.  Normal blood pressure is 120/80. 2. Weight- Have your body mass index (BMI) calculated to screen for obesity.  BMI is measure of body fat based on height and weight.  You can also calculate your own BMI at https://www.west-esparza.com/www.nhlbisupport.com/bmi/. 3. Cholesterol- Have your cholesterol checked every 5 years starting at age 32 then yearly starting at age 32. 4. Chlamydia, HIV, and other sexually transmitted diseases- Get screened every year until age 32, then within three months of each new sexual provider. 5. Pap Smear- Every 1-3 years; discuss with your health care provider. 6. Mammogram- Every year starting at age 32  Take these medicines  Calcium with Vitamin D-Your body needs 1200 mg of Calcium each day and [phone] IU of Vitamin D daily.  Your body can only absorb 500 mg of Calcium at a time so Calcium must be taken in 2 or 3 divided doses throughout the day.  Multivitamin with folic acid- Once daily if it is possible for you to become pregnant.  Get these Immunizations  Gardasil-Series of three doses; prevents HPV related illness such as genital warts and cervical cancer.  Menactra-Single dose; prevents meningitis.  Tetanus shot- Every 10 years.  Flu shot-Every year.  Take these steps 1. Do not smoke-Your healthcare provider can help you quit.  For tips on how to quit go to www.smokefree.gov or call 1-800 QUITNOW. 2. Be physically active- Exercise 5 days a week for at least 30 minutes.  If you are not already physically active, start slow and gradually work up to 30 minutes of moderate physical activity.  Examples of moderate activity include walking briskly, dancing, swimming, bicycling, etc. 3. Breast Cancer- A self breast exam every month is important for early detection of breast cancer.  For more information and instruction  on self breast exams, ask your healthcare provider or [place].eswww.womenshealth.gov/faq/breast-self-exam.cfm. 4. Eat a healthy diet- Eat a variety of healthy foods such as fruits, vegetables, whole grains, low fat milk, low fat cheeses, yogurt, lean meats, poultry and fish, beans, nuts, tofu, etc.  For more information go to www. Thenutritionsource.org 5. Drink alcohol in moderation- Limit alcohol intake to one drink or less per day. Never drink and drive. 6. Depression- Your emotional health is as important as your physical health.  If you're feeling down or losing interest in things you normally enjoy please talk to your healthcare provider about being screened for depression. 7. Dental visit- Brush and floss your teeth twice daily; visit your dentist twice a year. 8. Eye doctor- Get an eye exam at least every 2 years. 9. Helmet use- Always wear a helmet when riding a bicycle, motorcycle, rollerblading or skateboarding. 10. Safe sex- If you may be exposed to sexually transmitted infections, use a condom. 11. Seat belts- Seat belts can save your live; always wear one. 12. Smoke/Carbon Monoxide detectors- These detectors need to be installed on the appropriate level of your home. Replace batteries at least once a year. 13. Skin cancer- When out in the sun please cover up and use sunscreen 15 SPF or higher. 14. Violence- If anyone is threatening or hurting you, please tell your healthcare provider.

## 2015-01-27 NOTE — Progress Notes (Signed)
  Subjective:     Chelsey Guzman is a 34 y.o. female and is here for a comprehensive physical exam. The patient reports problems - continues to be fatigued with no energy. recently seen by hematology and rheumatology. eczema has recently flared. [name]  History   Social History  . Marital Status: Single    Spouse Name: N/A  . Number of Children: N/A  . Years of Education: N/A   Occupational History  .  [employer]   Social History Main Topics  . Smoking status: Never Smoker   . Smokeless tobacco: Never Used     Comment: never used tobacco  . Alcohol Use: No  . Drug Use: No  . Sexual Activity:    Partners: Male   Other Topics Concern  . Not on file   Social History Narrative   Health Maintenance  Topic Date Due  . HIV Screening  07/13/1998  . PAP SMEAR  01/09/2015  . INFLUENZA VACCINE  02/20/2015 (Originally 06/22/2014)  . TETANUS/TDAP  01/09/2022    The following portions of the patient's history were reviewed and updated as appropriate: allergies, current medications, past family history, past medical history, past social history, past surgical history and problem list.  Review of Systems A comprehensive review of systems was negative.   Objective:    BP 127/71 mmHg  Pulse 113  Ht $R'5\' 5"'Im$  (1.651 m)  Wt 198 lb (89.812 kg)  BMI 32.95 kg/m2 General appearance: alert, cooperative, fatigued and moderately obese Head: Normocephalic, without obvious abnormality, atraumatic Eyes: conjunctivae/corneas clear. PERRL, EOM's intact. Fundi benign. Ears: normal TM's and external ear canals both ears Nose: Nares normal. Septum midline. Mucosa normal. No drainage or sinus tenderness. Throat: lips, mucosa, and tongue normal; teeth and gums normal Neck: no adenopathy, no carotid bruit, no JVD, supple, symmetrical, trachea midline and thyroid bilaterally enlarged, non tender.  Back: symmetric, no curvature. ROM normal. No CVA tenderness. Lungs: clear to auscultation  bilaterally Heart: regular rate and rhythm, S1, S2 normal, no murmur, click, rub or gallop Abdomen: soft, non-tender; bowel sounds normal; no masses,  no organomegaly Extremities: extremities normal, atraumatic, no cyanosis or edema Pulses: 2+ and symmetric Skin: Skin color, texture, turgor normal. No rashes or lesions spots of dry scaly patches all over trunk and lower/upper extremities.  Lymph nodes: Cervical, supraclavicular, and axillary nodes normal. Neurologic: Grossly normal    Assessment:    Healthy female exam.      Plan:    CPE- LDL elevated but cardiac risk is still low at 1percent. Work on low fat diet and exercise. Abnormal pap hx last pap 2013 and normal. Has GYN. On vitamin D and B12. Continue calcium 4 servings daily or $Remove'1200mg'UAoYSEf$ . HO given.   Thyroid enlargement- last ultrasound thyroid enlargement but did not meet biopsy criteria. TSH levels normal.   Eczema- try aquafor and/or coccunut oil. Stay away from hot baths. Pt declined topical steroids. Offered eucerin/triamcinolone pt stated did not work.   Thrombocytopenia- managed by hematology and peter ennver. Had bone marrow biopsy in Jan. 2016.    See After Visit Summary for Counseling Recommendations

## 2015-08-04 ENCOUNTER — Ambulatory Visit (INDEPENDENT_AMBULATORY_CARE_PROVIDER_SITE_OTHER): Payer: BLUE CROSS/BLUE SHIELD | Admitting: Physician Assistant

## 2015-08-04 VITALS — BP 121/79 | HR 97 | Ht 65.0 in | Wt 210.0 lb

## 2015-08-04 DIAGNOSIS — R635 Abnormal weight gain: Secondary | ICD-10-CM

## 2015-08-04 DIAGNOSIS — E559 Vitamin D deficiency, unspecified: Secondary | ICD-10-CM

## 2015-08-04 DIAGNOSIS — D691 Qualitative platelet defects: Secondary | ICD-10-CM

## 2015-08-04 DIAGNOSIS — D693 Immune thrombocytopenic purpura: Secondary | ICD-10-CM | POA: Diagnosis not present

## 2015-08-04 MED ORDER — PHENTERMINE HCL 37.5 MG PO TABS: 37.5000 mg | ORAL_TABLET | Freq: Every day | ORAL | Status: DC

## 2015-08-04 NOTE — Progress Notes (Signed)
   Subjective:    Patient ID: Chelsey Guzman, female    DOB: [birth year], 34 y.o.   MRN: [id number]  HPI   Patient is a 34 year old female who presents to the clinic for follow-up. She is currently being seen by a hematologist. 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 This was found to be the cause of her low platelets. In the future she may have to receive transfusions. There was also found to be some vitamin D deficiency. Her hematologist would like her PCP to manage this. She has been out of her 50,000 unit vitamin D for about a week and a half.  Patient would also like to focus on weight loss. She is currently started to exercise on a regular basis. She is still struggling with making diet changes.  Review of Systems  All other systems reviewed and are negative.      Objective:   Physical Exam  Constitutional: She is oriented to person, place, and time. She appears well-developed and well-nourished.  HENT:  Head: Normocephalic and atraumatic.  Cardiovascular: Normal rate, regular rhythm and normal heart sounds.   Pulmonary/Chest: Effort normal and breath sounds normal.  Neurological: She is alert and oriented to person, place, and time.  Psychiatric: She has a normal mood and affect. Her behavior is normal.          Assessment & Plan:  Abnormal weight gain-we'll check thyroid level today. BMI is 34.9 today. Did start phentermine. Discussed side effects. Encouraged patient to continue weight management with diet and exercise. Follow-up in one month with nurse visit.  Vitamin D deficiency- we'll recheck vitamin D today and assess treatment plan. If her vitamin D is low there could perhaps be some other vitamin deficiencies. Will check B12.  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

## 2015-08-05 ENCOUNTER — Encounter: Payer: Self-pay | Admitting: Physician Assistant

## 2015-08-05 DIAGNOSIS — E559 Vitamin D deficiency, unspecified: Secondary | ICD-10-CM | POA: Insufficient documentation

## 2015-08-05 LAB — FOLATE: Folate: 13.3 ng/mL

## 2015-08-05 LAB — VITAMIN D 25 HYDROXY (VIT D DEFICIENCY, FRACTURES): Vit D, 25-Hydroxy: 28 ng/mL — ABNORMAL LOW (ref 30–100)

## 2015-08-05 LAB — CBC WITH DIFFERENTIAL/PLATELET
Basophils Absolute: 0 10*3/uL (ref 0.0–0.1)
Basophils Relative: 0 % (ref 0–1)
Eosinophils Absolute: 0.2 10*3/uL (ref 0.0–0.7)
Eosinophils Relative: 2 % (ref 0–5)
HCT: 40.4 % (ref 36.0–46.0)
Hemoglobin: 13 g/dL (ref 12.0–15.0)
Lymphocytes Relative: 30 % (ref 12–46)
Lymphs Abs: 2.3 10*3/uL (ref 0.7–4.0)
MCH: 28.5 pg (ref 26.0–34.0)
MCHC: 32.2 g/dL (ref 30.0–36.0)
MCV: 88.6 fL (ref 78.0–100.0)
MPV: 12 fL (ref 8.6–12.4)
Monocytes Absolute: 0.7 10*3/uL (ref 0.1–1.0)
Monocytes Relative: 9 % (ref 3–12)
Neutro Abs: 4.5 10*3/uL (ref 1.7–7.7)
Neutrophils Relative %: 59 % (ref 43–77)
Platelets: 154 10*3/uL (ref 150–400)
RBC: 4.56 MIL/uL (ref 3.87–5.11)
RDW: 13.8 % (ref 11.5–15.5)
WBC: 7.6 10*3/uL (ref 4.0–10.5)

## 2015-08-05 LAB — TSH: TSH: 2.456 u[IU]/mL (ref 0.350–4.500)

## 2015-08-05 LAB — VITAMIN B12: Vitamin B-12: 379 pg/mL (ref 211–911)

## 2015-08-07 LAB — QUESTASSURED 25-HYDROXYVIT D D2/D3
Vitamin D, 25-OH, D2: 31 ng/mL
Vitamin D, 25-OH, D3: 8 ng/mL
Vitamin D, 25-OH, Total: 39 ng/mL (ref 30–100)

## 2015-08-19 ENCOUNTER — Telehealth: Payer: Self-pay | Admitting: Physician Assistant

## 2015-08-19 DIAGNOSIS — Z8639 Personal history of other endocrine, nutritional and metabolic disease: Secondary | ICD-10-CM | POA: Insufficient documentation

## 2015-08-19 DIAGNOSIS — E559 Vitamin D deficiency, unspecified: Secondary | ICD-10-CM

## 2015-08-19 DIAGNOSIS — E538 Deficiency of other specified B group vitamins: Secondary | ICD-10-CM

## 2015-08-19 MED ORDER — VITAMIN D (ERGOCALCIFEROL) 1.25 MG (50000 UNIT) PO CAPS: 50000.0000 [IU] | ORAL_CAPSULE | ORAL | Status: DC

## 2015-08-19 NOTE — Telephone Encounter (Signed)
Just received labs:  Thyroid good. CBC good.  b12 on low side at 379. Start OTC b12 . Vitamin D just barely in normal range continue on 50,000 units for 2 months weekly then transition to D3 5,000units daily with recheck in 2 months after being on daily.

## 2015-08-21 ENCOUNTER — Encounter: Payer: Self-pay | Admitting: Physician Assistant

## 2015-09-01 ENCOUNTER — Other Ambulatory Visit: Payer: Self-pay

## 2015-09-01 ENCOUNTER — Ambulatory Visit (INDEPENDENT_AMBULATORY_CARE_PROVIDER_SITE_OTHER): Payer: BLUE CROSS/BLUE SHIELD | Admitting: Physician Assistant

## 2015-09-01 VITALS — BP 109/71 | HR 94 | Resp 16 | Wt 200.0 lb

## 2015-09-01 DIAGNOSIS — R635 Abnormal weight gain: Secondary | ICD-10-CM

## 2015-09-01 MED ORDER — PHENTERMINE HCL 37.5 MG PO TABS: 37.5000 mg | ORAL_TABLET | Freq: Every day | ORAL | Status: DC

## 2015-09-01 NOTE — Progress Notes (Signed)
   Subjective:    Patient ID: Chelsey Guzman, female    DOB: 1981-01-26, 34 y.o.   MRN: [id number]  HPIPatient here for blood pressure and weight check. She denies trouble sleeping, palpitations or medication problems.    Review of Systems     Objective:   Physical Exam        Assessment & Plan:  Patient has lost weight. A refill for phentermine will be faxed to pharmacy. Patient advised to schedule a follow up with nurse/provider in 30 days. Declines Flu vaccination today.

## 2015-10-13 ENCOUNTER — Other Ambulatory Visit: Payer: Self-pay | Admitting: Physician Assistant

## 2015-10-13 ENCOUNTER — Telehealth: Payer: Self-pay | Admitting: *Deleted

## 2015-10-13 DIAGNOSIS — D696 Thrombocytopenia, unspecified: Secondary | ICD-10-CM

## 2015-10-13 DIAGNOSIS — E559 Vitamin D deficiency, unspecified: Secondary | ICD-10-CM

## 2015-10-13 NOTE — Telephone Encounter (Signed)
Labs ordered.

## 2015-10-14 LAB — CBC WITH DIFFERENTIAL/PLATELET
Basophils Absolute: 0.1 10*3/uL (ref 0.0–0.1)
Basophils Relative: 1 % (ref 0–1)
Eosinophils Absolute: 0.2 10*3/uL (ref 0.0–0.7)
Eosinophils Relative: 2 % (ref 0–5)
HCT: 41.6 % (ref 36.0–46.0)
Hemoglobin: 13.4 g/dL (ref 12.0–15.0)
Lymphocytes Relative: 36 % (ref 12–46)
Lymphs Abs: 3 10*3/uL (ref 0.7–4.0)
MCH: 28.8 pg (ref 26.0–34.0)
MCHC: 32.2 g/dL (ref 30.0–36.0)
MCV: 89.5 fL (ref 78.0–100.0)
MPV: 12.1 fL (ref 8.6–12.4)
Monocytes Absolute: 0.7 10*3/uL (ref 0.1–1.0)
Monocytes Relative: 8 % (ref 3–12)
Neutro Abs: 4.5 10*3/uL (ref 1.7–7.7)
Neutrophils Relative %: 53 % (ref 43–77)
Platelets: 115 10*3/uL — ABNORMAL LOW (ref 150–400)
RBC: 4.65 MIL/uL (ref 3.87–5.11)
RDW: 13.2 % (ref 11.5–15.5)
WBC: 8.4 10*3/uL (ref 4.0–10.5)

## 2015-10-14 LAB — VITAMIN D 25 HYDROXY (VIT D DEFICIENCY, FRACTURES): Vit D, 25-Hydroxy: 42 ng/mL (ref 30–100)

## 2015-10-17 LAB — QUESTASSURED 25-HYDROXYVIT D D2/D3
Vitamin D, 25-OH, D2: 48 ng/mL
Vitamin D, 25-OH, D3: 7 ng/mL
Vitamin D, 25-OH, Total: 55 ng/mL (ref 30–100)

## 2015-10-20 ENCOUNTER — Encounter: Payer: Self-pay | Admitting: Physician Assistant

## 2015-10-20 ENCOUNTER — Ambulatory Visit (INDEPENDENT_AMBULATORY_CARE_PROVIDER_SITE_OTHER): Payer: BLUE CROSS/BLUE SHIELD | Admitting: Physician Assistant

## 2015-10-20 VITALS — BP 129/75 | HR 88 | Ht 65.0 in | Wt 194.0 lb

## 2015-10-20 DIAGNOSIS — E559 Vitamin D deficiency, unspecified: Secondary | ICD-10-CM

## 2015-10-20 DIAGNOSIS — R635 Abnormal weight gain: Secondary | ICD-10-CM | POA: Diagnosis not present

## 2015-10-20 MED ORDER — VITAMIN D (ERGOCALCIFEROL) 1.25 MG (50000 UNIT) PO CAPS: 50000.0000 [IU] | ORAL_CAPSULE | ORAL | Status: DC

## 2015-10-20 MED ORDER — PHENTERMINE HCL 37.5 MG PO TABS: 37.5000 mg | ORAL_TABLET | Freq: Every day | ORAL | Status: DC

## 2015-10-20 NOTE — Progress Notes (Signed)
   Subjective:    Patient ID: Chelsey SoursMadeline Eusebio, female    DOB: 03-02-1981, 34 y.o.   MRN: [id number]  HPI Patient is a 34 year old female who presents to the clinic to follow-up on weight since starting phentermine. She started phentermine for the last 2 months. In the last month she is also another 6 pounds. She denies any side effects of insomnia, dry mouth, constipation or increased irritability. She has started using Slim fast asthma or placement for 2 meals and a sensible dinner. She is trying to walk a couple times a week. She admits she is not doing a lot of exercise.  She has a history of vitamin D deficiency that we are managing. She is currently on weekly if the thousand units of vitamin D. She is doing well. She had her labs last checked couple weeks ago and vitamin 3 and all quadrants of vitamin 2 were in the normal range. Historically what she has stopped vitamin D weekly her levels have plumented again.    Review of Systems  All other systems reviewed and are negative.      Objective:   Physical Exam  Constitutional: She is oriented to person, place, and time. She appears well-developed and well-nourished.  HENT:  Head: Normocephalic and atraumatic.  Cardiovascular: Normal rate, regular rhythm and normal heart sounds.   Pulmonary/Chest: Effort normal and breath sounds normal.  Neurological: She is alert and oriented to person, place, and time.  Psychiatric: She has a normal mood and affect. Her behavior is normal.          Assessment & Plan:  Abnormal weight gain- refill phentermine for the next 2 months. Encourage regular exercise and increasing intensity. Continue to watch calories and keep around 1500.  Vitamin D deficiency-I would like to keep patient on vitamin D 50,000 units weekly. Historically her levels have dropped after being taken off this dose. 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

## 2015-10-29 ENCOUNTER — Encounter: Payer: Self-pay | Admitting: Physician Assistant

## 2016-01-11 IMAGING — US US ABDOMEN COMPLETE
1 series · 14 of 25 positions shown · non-contrast
Comparison: None.

CLINICAL DATA: Low platelet count.

EXAM:
ULTRASOUND ABDOMEN COMPLETE

[Series 1: us abdomen complete · 0.30mm/px · 14 of 90 slices shown]
[im 1/90]
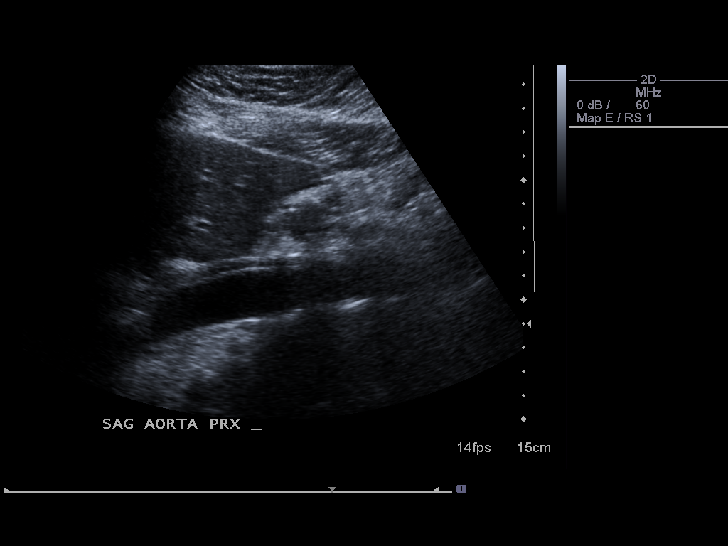
[im 8/90]
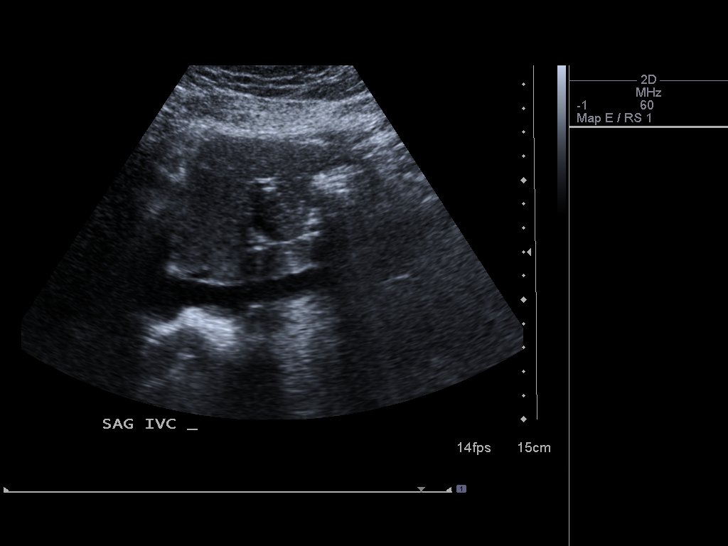
[im 15/90]
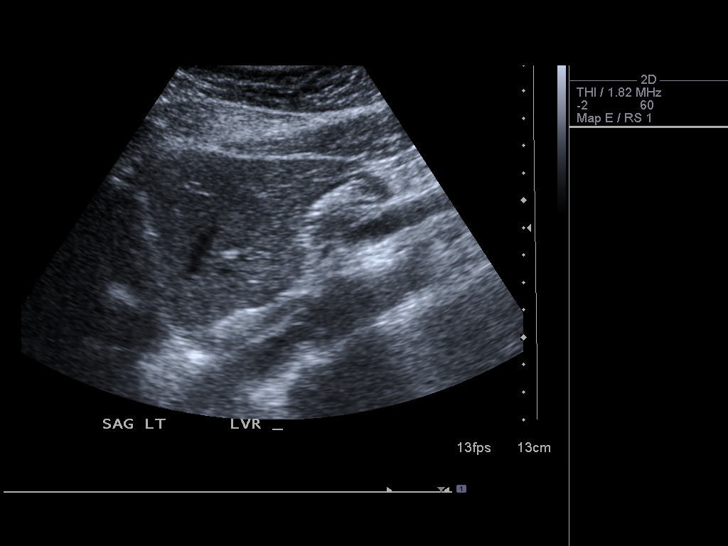
[im 23/90]
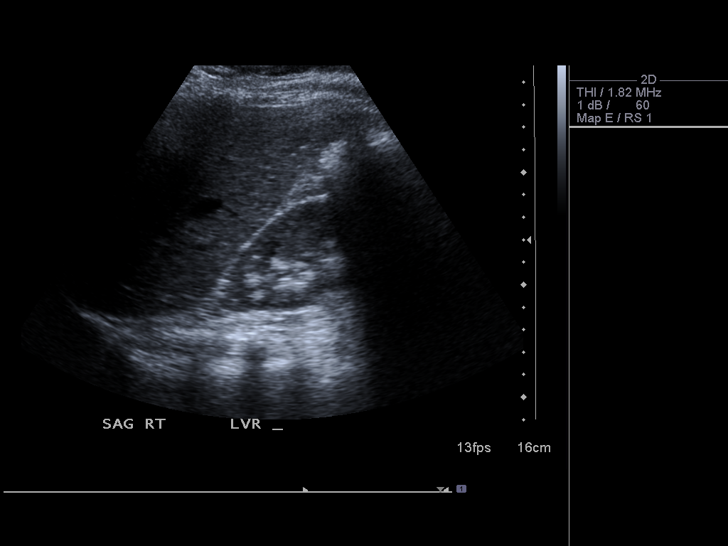
[im 30/90]
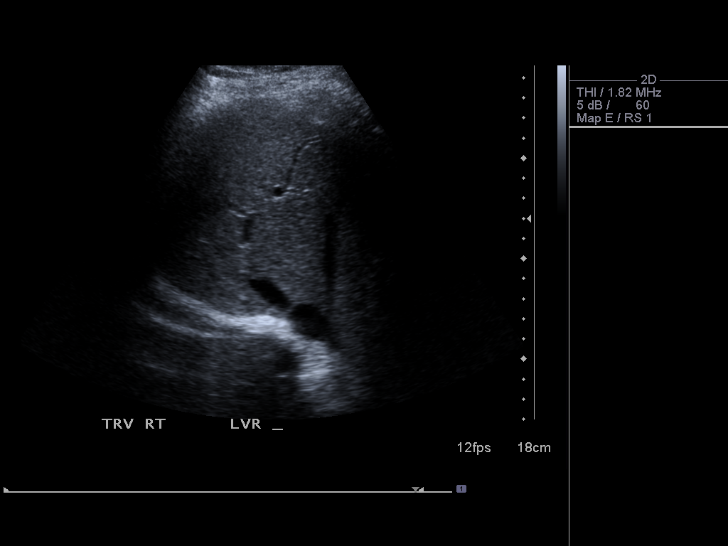
[im 34/90]
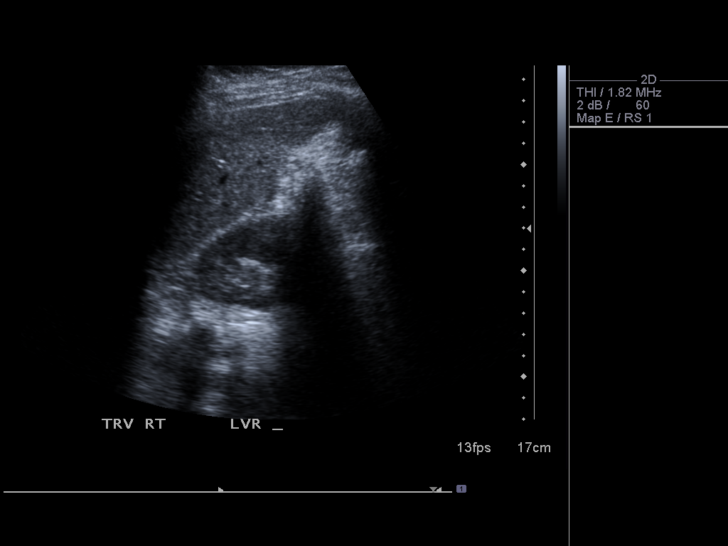
[im 41/90]
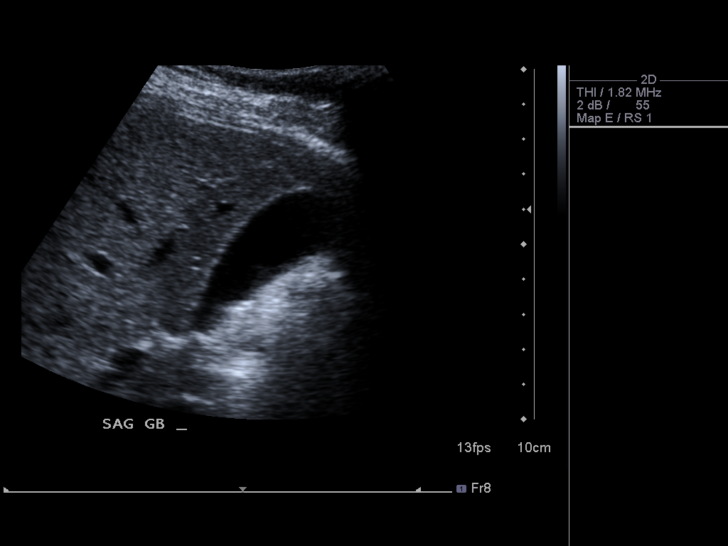
[im 49/90]
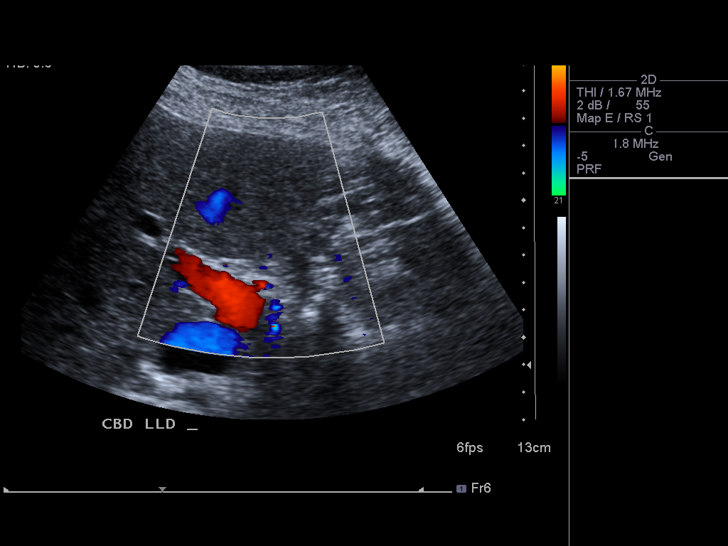
[im 56/90]
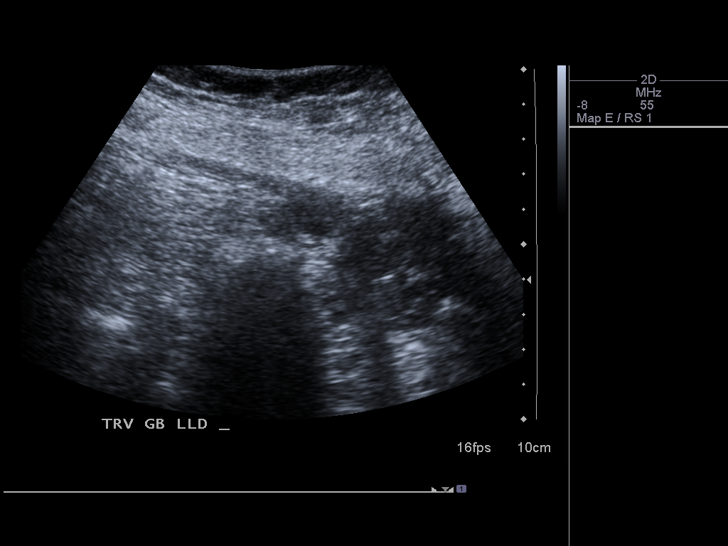
[im 60/90]
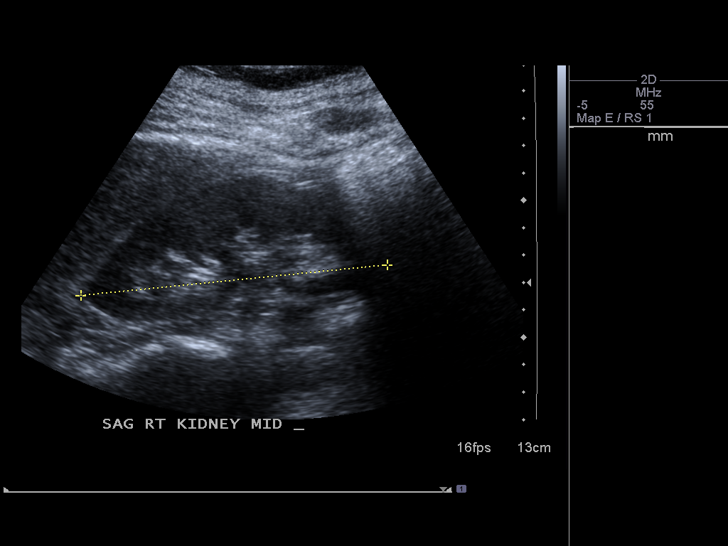
[im 67/90]
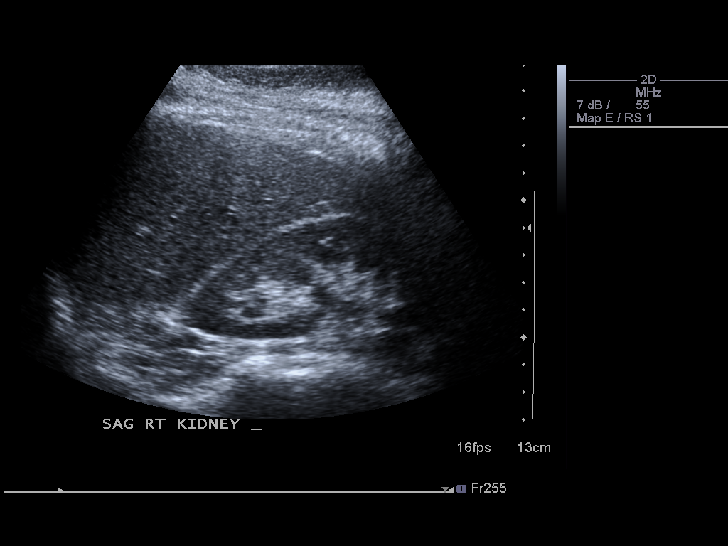
[im 75/90]
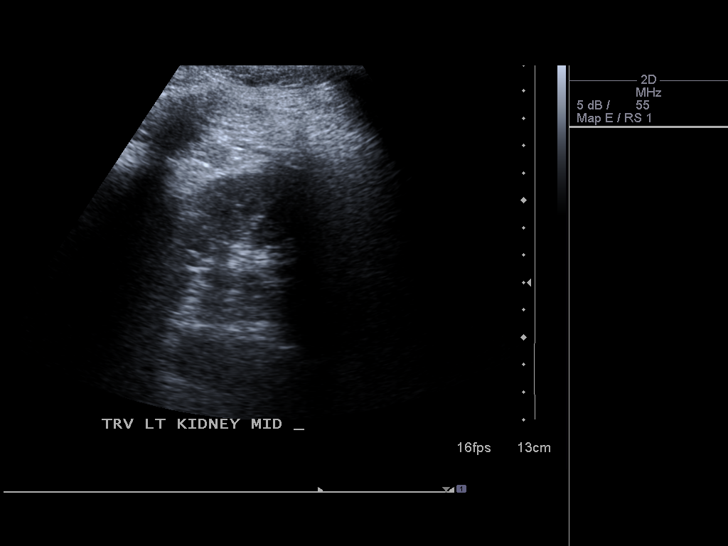
[im 82/90]
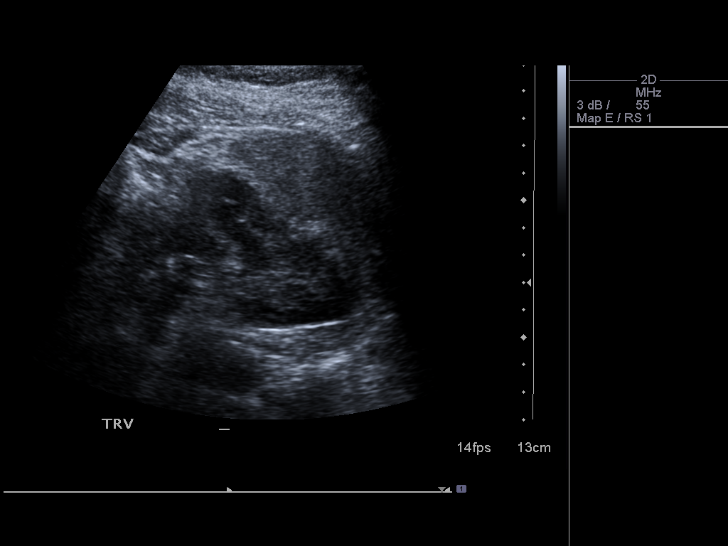
[im 90/90]
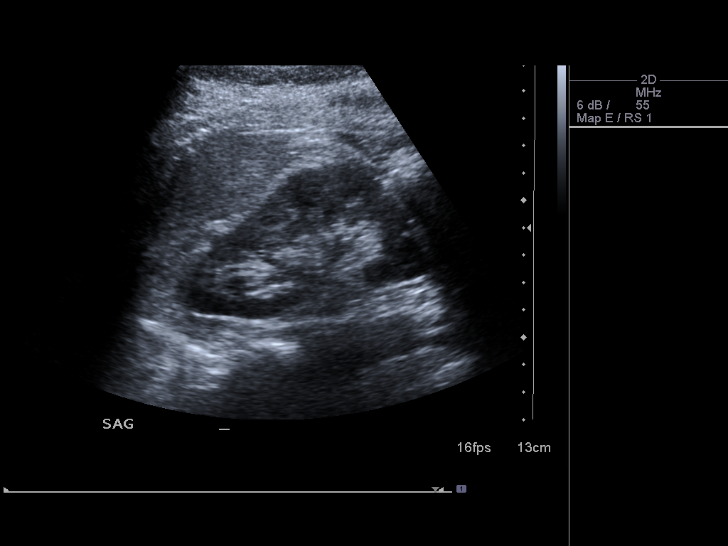

[14 of 25 positions shown; findings below may reference images not displayed]

FINDINGS: Gallbladder:

No gallstones or wall thickening visualized. No sonographic Murphy
sign noted.

Common bile duct:

Diameter: 3 mm.

Liver:

No focal lesion identified. Within normal limits in parenchymal
echogenicity.

IVC:

No abnormality visualized.

Pancreas:

Visualized portion unremarkable.

Spleen:

Size and appearance within normal limits. A small splenule is noted
measuring 1.1 cm

Right Kidney:

Length: 11.2 cm in length. Echogenicity within normal limits. No
mass or hydronephrosis visualized.

Left Kidney:

Length: 11.1 cm in length. Echogenicity within normal limits. No
mass or hydronephrosis visualized.

Abdominal aorta:

No aneurysm visualized.

Other findings:

None.
IMPRESSION: Within normal limits.

## 2016-02-09 ENCOUNTER — Other Ambulatory Visit: Payer: Self-pay | Admitting: *Deleted

## 2016-02-09 MED ORDER — VITAMIN D (ERGOCALCIFEROL) 1.25 MG (50000 UNIT) PO CAPS: 50000.0000 [IU] | ORAL_CAPSULE | ORAL | Status: DC

## 2016-05-04 DIAGNOSIS — D696 Thrombocytopenia, unspecified: Secondary | ICD-10-CM | POA: Diagnosis not present

## 2016-05-04 DIAGNOSIS — D691 Qualitative platelet defects: Secondary | ICD-10-CM | POA: Diagnosis not present

## 2016-05-04 DIAGNOSIS — N920 Excessive and frequent menstruation with regular cycle: Secondary | ICD-10-CM | POA: Diagnosis not present

## 2016-05-04 DIAGNOSIS — E538 Deficiency of other specified B group vitamins: Secondary | ICD-10-CM | POA: Diagnosis not present

## 2016-07-20 ENCOUNTER — Encounter: Payer: Self-pay | Admitting: Physician Assistant

## 2016-07-20 ENCOUNTER — Ambulatory Visit (INDEPENDENT_AMBULATORY_CARE_PROVIDER_SITE_OTHER): Payer: BLUE CROSS/BLUE SHIELD | Admitting: Physician Assistant

## 2016-07-20 VITALS — BP 123/63 | HR 89 | Ht 65.0 in | Wt 217.0 lb

## 2016-07-20 DIAGNOSIS — E669 Obesity, unspecified: Secondary | ICD-10-CM

## 2016-07-20 DIAGNOSIS — R350 Frequency of micturition: Secondary | ICD-10-CM

## 2016-07-20 DIAGNOSIS — L309 Dermatitis, unspecified: Secondary | ICD-10-CM | POA: Insufficient documentation

## 2016-07-20 DIAGNOSIS — N3001 Acute cystitis with hematuria: Secondary | ICD-10-CM | POA: Diagnosis not present

## 2016-07-20 DIAGNOSIS — R300 Dysuria: Secondary | ICD-10-CM | POA: Diagnosis not present

## 2016-07-20 LAB — POCT URINALYSIS DIPSTICK
Bilirubin, UA: NEGATIVE
Glucose, UA: NEGATIVE
Ketones, UA: NEGATIVE
Nitrite, UA: NEGATIVE
Protein, UA: >=300
Spec Grav, UA: >=1.03
Urobilinogen, UA: 0.2
pH, UA: 6.5

## 2016-07-20 MED ORDER — TRIAMCINOLONE ACETONIDE 0.1 % EX CREA: 1.0000 "application " | TOPICAL_CREAM | Freq: Two times a day (BID) | CUTANEOUS | 1 refills | Status: DC

## 2016-07-20 MED ORDER — CIPROFLOXACIN HCL 500 MG PO TABS: 500.0000 mg | ORAL_TABLET | Freq: Two times a day (BID) | ORAL | 0 refills | Status: DC

## 2016-07-20 MED ORDER — LORCASERIN HCL 10 MG PO TABS: 1.0000 | ORAL_TABLET | Freq: Two times a day (BID) | ORAL | 2 refills | Status: DC

## 2016-07-20 MED ORDER — PHENAZOPYRIDINE HCL 200 MG PO TABS: 200.0000 mg | ORAL_TABLET | Freq: Three times a day (TID) | ORAL | 0 refills | Status: AC

## 2016-07-20 NOTE — Progress Notes (Signed)
   Subjective:    Patient ID: Chelsey Guzman, female    DOB: 11-Oct-1981, 35 y.o.   MRN: [id number]  HPI Patient is a 35 year old female who presents to the clinic with dysuria and increased urinary frequency for the last oriented hours. She's had only one other urinary tract infection in her life. She states this feels very similar. She denies any fever, chills, nausea or vomiting. She denies any flank pain. She is having some left lower abdominal discomfort. She has not tried anything to make better.  She does request some topical steroid cream for her eczema that pops up random places.  She is very discouraged by her weight. She had lost 20-30 pounds on phentermine. She seems to have gained it all back. She knows she is not very active but she does not feel like she's eating any more pain she used to. She is interested in other medications.   Review of Systems See history of present illness    Objective:   Physical Exam  Constitutional: She is oriented to person, place, and time. She appears well-developed and well-nourished.  Obesity  HENT:  Head: Normocephalic and atraumatic.  Cardiovascular: Normal rate, regular rhythm and normal heart sounds.   Pulmonary/Chest: Effort normal and breath sounds normal.  No CVA tenderness.  Abdominal: Soft. Bowel sounds are normal. She exhibits no distension. There is tenderness. There is no rebound and no guarding.  Mild Tenderness in the left lower quadrant and over the suprapubic area. No guarding or rebound.  Neurological: She is alert and oriented to person, place, and time.  Psychiatric: She has a normal mood and affect. Her behavior is normal.          Assessment & Plan:  Acute cystitis with hematuria- .. Results for orders placed or performed in visit on 07/20/16  POCT urinalysis dipstick  Result Value Ref Range   Color, UA dark yellow    Clarity, UA turbid    Glucose, UA neg    Bilirubin, UA neg    Ketones, UA neg    Spec  Grav, UA >=1.030    Blood, UA large    pH, UA 6.5    Protein, UA >=300    Urobilinogen, UA 0.2    Nitrite, UA neg    Leukocytes, UA small (1+) (A) Negative   Treated with cipro and pyridium for 3 days. Discussed symptomatic care. Follow up as needed. Stay hydrated.   Eczema- triamcinolone cream refilled.   Obesity- pt did well on phentermine but gained weight back. Discussed other options. Will try belviq. Discussed side effects. Coupon card given. Discussed 1500-calorie diet daily and at least 150 minutes of exercise a day. Follow up in 2 months.

## 2016-07-20 NOTE — Patient Instructions (Signed)

## 2016-07-21 ENCOUNTER — Telehealth: Payer: Self-pay | Admitting: *Deleted

## 2016-07-21 NOTE — Telephone Encounter (Signed)
PA initiated through covermymeds [facility] [id number]

## 2016-07-27 NOTE — Telephone Encounter (Signed)
Belviq approved. Ref #: [id number]. Valid: 07/21/16-01/16/17. Pharmacy advised.

## 2016-10-29 ENCOUNTER — Other Ambulatory Visit: Payer: Self-pay | Admitting: Physician Assistant

## 2016-12-31 ENCOUNTER — Telehealth: Payer: Self-pay | Admitting: Physician Assistant

## 2016-12-31 NOTE — Telephone Encounter (Signed)
Called patient to see if they have received or would like to receive a flu vaccination. Patient did not answer and did not have a answering machine to leave a message. - CF

## 2017-01-26 DIAGNOSIS — L209 Atopic dermatitis, unspecified: Secondary | ICD-10-CM | POA: Diagnosis not present

## 2017-03-09 DIAGNOSIS — J3089 Other allergic rhinitis: Secondary | ICD-10-CM | POA: Diagnosis not present

## 2017-03-09 DIAGNOSIS — J3081 Allergic rhinitis due to animal (cat) (dog) hair and dander: Secondary | ICD-10-CM | POA: Diagnosis not present

## 2017-03-09 DIAGNOSIS — L209 Atopic dermatitis, unspecified: Secondary | ICD-10-CM | POA: Diagnosis not present

## 2017-03-09 DIAGNOSIS — J301 Allergic rhinitis due to pollen: Secondary | ICD-10-CM | POA: Diagnosis not present

## 2017-05-04 DIAGNOSIS — R238 Other skin changes: Secondary | ICD-10-CM | POA: Diagnosis not present

## 2017-05-04 DIAGNOSIS — D696 Thrombocytopenia, unspecified: Secondary | ICD-10-CM | POA: Diagnosis not present

## 2017-06-08 DIAGNOSIS — J301 Allergic rhinitis due to pollen: Secondary | ICD-10-CM | POA: Diagnosis not present

## 2017-06-08 DIAGNOSIS — J3089 Other allergic rhinitis: Secondary | ICD-10-CM | POA: Diagnosis not present

## 2017-06-08 DIAGNOSIS — L209 Atopic dermatitis, unspecified: Secondary | ICD-10-CM | POA: Diagnosis not present

## 2017-06-08 DIAGNOSIS — J3081 Allergic rhinitis due to animal (cat) (dog) hair and dander: Secondary | ICD-10-CM | POA: Diagnosis not present

## 2017-07-30 DIAGNOSIS — H04129 Dry eye syndrome of unspecified lacrimal gland: Secondary | ICD-10-CM | POA: Diagnosis not present

## 2017-08-23 DIAGNOSIS — N898 Other specified noninflammatory disorders of vagina: Secondary | ICD-10-CM | POA: Diagnosis not present

## 2017-08-23 DIAGNOSIS — Z124 Encounter for screening for malignant neoplasm of cervix: Secondary | ICD-10-CM | POA: Diagnosis not present

## 2017-08-23 DIAGNOSIS — Z87898 Personal history of other specified conditions: Secondary | ICD-10-CM | POA: Diagnosis not present

## 2017-08-23 DIAGNOSIS — Z01419 Encounter for gynecological examination (general) (routine) without abnormal findings: Secondary | ICD-10-CM | POA: Diagnosis not present

## 2017-08-23 LAB — HM PAP SMEAR: HM Pap smear: NEGATIVE

## 2017-12-05 DIAGNOSIS — J3081 Allergic rhinitis due to animal (cat) (dog) hair and dander: Secondary | ICD-10-CM | POA: Diagnosis not present

## 2017-12-05 DIAGNOSIS — J3089 Other allergic rhinitis: Secondary | ICD-10-CM | POA: Diagnosis not present

## 2017-12-05 DIAGNOSIS — L209 Atopic dermatitis, unspecified: Secondary | ICD-10-CM | POA: Diagnosis not present

## 2017-12-05 DIAGNOSIS — J301 Allergic rhinitis due to pollen: Secondary | ICD-10-CM | POA: Diagnosis not present

## 2018-02-14 ENCOUNTER — Other Ambulatory Visit: Payer: Self-pay | Admitting: Physician Assistant

## 2018-02-24 ENCOUNTER — Telehealth: Payer: Self-pay | Admitting: Physician Assistant

## 2018-02-24 DIAGNOSIS — Z131 Encounter for screening for diabetes mellitus: Secondary | ICD-10-CM

## 2018-02-24 DIAGNOSIS — E559 Vitamin D deficiency, unspecified: Secondary | ICD-10-CM

## 2018-02-24 DIAGNOSIS — E049 Nontoxic goiter, unspecified: Secondary | ICD-10-CM

## 2018-02-24 DIAGNOSIS — E785 Hyperlipidemia, unspecified: Secondary | ICD-10-CM

## 2018-02-24 NOTE — Telephone Encounter (Signed)
Labs ordered & faxed to lab.  Pt notified. 

## 2018-02-24 NOTE — Telephone Encounter (Signed)
Pt called and stated she has a cpe scheduled for next Thursday and wants to know if she can get labs ordered before hand. She would like to make sure her Vit. D is check as well. Thanks

## 2018-02-27 DIAGNOSIS — E785 Hyperlipidemia, unspecified: Secondary | ICD-10-CM | POA: Diagnosis not present

## 2018-02-27 DIAGNOSIS — E559 Vitamin D deficiency, unspecified: Secondary | ICD-10-CM | POA: Diagnosis not present

## 2018-02-27 DIAGNOSIS — Z131 Encounter for screening for diabetes mellitus: Secondary | ICD-10-CM | POA: Diagnosis not present

## 2018-02-27 DIAGNOSIS — E049 Nontoxic goiter, unspecified: Secondary | ICD-10-CM | POA: Diagnosis not present

## 2018-02-28 LAB — COMPLETE METABOLIC PANEL WITH GFR
AG Ratio: 1.3 (calc) (ref 1.0–2.5)
ALT: 8 U/L (ref 6–29)
AST: 17 U/L (ref 10–30)
Albumin: 3.9 g/dL (ref 3.6–5.1)
Alkaline phosphatase (APISO): 67 U/L (ref 33–115)
BUN: 12 mg/dL (ref 7–25)
CO2: 28 mmol/L (ref 20–32)
Calcium: 9 mg/dL (ref 8.6–10.2)
Chloride: 106 mmol/L (ref 98–110)
Creat: 0.76 mg/dL (ref 0.50–1.10)
GFR, Est African American: 119 mL/min/{1.73_m2} (ref >=60)
GFR, Est Non African American: 102 mL/min/{1.73_m2} (ref >=60)
Globulin: 3 g/dL (calc) (ref 1.9–3.7)
Glucose, Bld: 98 mg/dL (ref 65–99)
Potassium: 4.2 mmol/L (ref 3.5–5.3)
Sodium: 140 mmol/L (ref 135–146)
Total Bilirubin: 0.4 mg/dL (ref 0.2–1.2)
Total Protein: 6.9 g/dL (ref 6.1–8.1)

## 2018-02-28 LAB — LIPID PANEL W/REFLEX DIRECT LDL
Cholesterol: 227 mg/dL — ABNORMAL HIGH (ref <200)
HDL: 52 mg/dL (ref >50)
LDL Cholesterol (Calc): 151 mg/dL (calc) — ABNORMAL HIGH
Non-HDL Cholesterol (Calc): 175 mg/dL (calc) — ABNORMAL HIGH (ref <130)
Total CHOL/HDL Ratio: 4.4 (calc) (ref <5.0)
Triglycerides: 119 mg/dL (ref <150)

## 2018-02-28 LAB — VITAMIN D 25 HYDROXY (VIT D DEFICIENCY, FRACTURES): Vit D, 25-Hydroxy: 17 ng/mL — ABNORMAL LOW (ref 30–100)

## 2018-02-28 LAB — TSH: TSH: 4.66 mIU/L — ABNORMAL HIGH

## 2018-03-02 ENCOUNTER — Ambulatory Visit (INDEPENDENT_AMBULATORY_CARE_PROVIDER_SITE_OTHER): Payer: BLUE CROSS/BLUE SHIELD | Admitting: Physician Assistant

## 2018-03-02 ENCOUNTER — Encounter: Payer: Self-pay | Admitting: Physician Assistant

## 2018-03-02 VITALS — BP 120/72 | HR 66 | Ht 65.0 in | Wt 237.0 lb

## 2018-03-02 DIAGNOSIS — Z862 Personal history of diseases of the blood and blood-forming organs and certain disorders involving the immune mechanism: Secondary | ICD-10-CM | POA: Diagnosis not present

## 2018-03-02 DIAGNOSIS — E039 Hypothyroidism, unspecified: Secondary | ICD-10-CM

## 2018-03-02 DIAGNOSIS — E559 Vitamin D deficiency, unspecified: Secondary | ICD-10-CM

## 2018-03-02 DIAGNOSIS — Z6839 Body mass index (BMI) 39.0-39.9, adult: Secondary | ICD-10-CM | POA: Diagnosis not present

## 2018-03-02 DIAGNOSIS — Z0000 Encounter for general adult medical examination without abnormal findings: Secondary | ICD-10-CM | POA: Diagnosis not present

## 2018-03-02 DIAGNOSIS — E6609 Other obesity due to excess calories: Secondary | ICD-10-CM

## 2018-03-02 MED ORDER — LEVOTHYROXINE SODIUM 25 MCG PO TABS: 25.0000 ug | ORAL_TABLET | Freq: Every day | ORAL | 1 refills | Status: DC

## 2018-03-02 MED ORDER — PHENTERMINE HCL 37.5 MG PO TABS
37.5000 mg | ORAL_TABLET | Freq: Every day | ORAL | 0 refills | Status: DC
Start: 2018-03-02 — End: 2018-03-31

## 2018-03-02 MED ORDER — VITAMIN D (ERGOCALCIFEROL) 1.25 MG (50000 UNIT) PO CAPS: 50000.0000 [IU] | ORAL_CAPSULE | ORAL | 0 refills | Status: DC

## 2018-03-02 NOTE — Telephone Encounter (Signed)
Call pt: kidney, liver, glucose look good. Thyroid a little out of normal range.  Cholesterol a little elevated.  Vitamin d low.   Will discuss fully at OV next week.

## 2018-03-02 NOTE — Patient Instructions (Signed)

## 2018-03-03 ENCOUNTER — Telehealth: Payer: Self-pay | Admitting: Physician Assistant

## 2018-03-03 LAB — CBC WITH DIFFERENTIAL/PLATELET
Basophils Absolute: 54 cells/uL (ref 0–200)
Basophils Relative: 0.7 %
Eosinophils Absolute: 92 cells/uL (ref 15–500)
Eosinophils Relative: 1.2 %
HCT: 40.2 % (ref 35.0–45.0)
Hemoglobin: 13.6 g/dL (ref 11.7–15.5)
Lymphs Abs: 2618 cells/uL (ref 850–3900)
MCH: 29.4 pg (ref 27.0–33.0)
MCHC: 33.8 g/dL (ref 32.0–36.0)
MCV: 86.8 fL (ref 80.0–100.0)
MPV: 12.5 fL (ref 7.5–12.5)
Monocytes Relative: 7.7 %
Neutro Abs: 4343 cells/uL (ref 1500–7800)
Neutrophils Relative %: 56.4 %
Platelets: 134 10*3/uL — ABNORMAL LOW (ref 140–400)
RBC: 4.63 10*6/uL (ref 3.80–5.10)
RDW: 12.4 % (ref 11.0–15.0)
Total Lymphocyte: 34 %
WBC mixed population: 593 cells/uL (ref 200–950)
WBC: 7.7 10*3/uL (ref 3.8–10.8)

## 2018-03-03 LAB — IRON,TIBC AND FERRITIN PANEL
%SAT: 15 % (calc) (ref 11–50)
Ferritin: 52 ng/mL (ref 10–154)
Iron: 56 ug/dL (ref 40–190)
TIBC: 378 mcg/dL (calc) (ref 250–450)

## 2018-03-03 MED ORDER — VITAMIN D (ERGOCALCIFEROL) 1.25 MG (50000 UNIT) PO CAPS: 50000.0000 [IU] | ORAL_CAPSULE | ORAL | 0 refills | Status: DC

## 2018-03-03 MED ORDER — LEVOTHYROXINE SODIUM 25 MCG PO TABS: 25.0000 ug | ORAL_TABLET | Freq: Every day | ORAL | 1 refills | Status: DC

## 2018-03-03 NOTE — Progress Notes (Signed)
Call pt: iron panel looks good as well. Platelets on the low side but better than 2 years ago. Not trending down.

## 2018-03-03 NOTE — Telephone Encounter (Signed)
Pt called. She did not get her scripts for Levothyroxine and Vitamin D - [facility] [address] [facility] [place]  [facility] [address] [place].

## 2018-03-03 NOTE — Telephone Encounter (Signed)
[facility] Pt advised.

## 2018-03-05 ENCOUNTER — Encounter: Payer: Self-pay | Admitting: Physician Assistant

## 2018-03-05 NOTE — Progress Notes (Signed)
Subjective:     Chelsey Guzman is a 37 y.o. female and is here for a comprehensive physical exam. The patient reports problems - she has noticed some bruising recently. with her hx of ITP she is concerned. she is also concerned with weight. she was doing well about 2 years ago and down to 180. she wants to start working on weight loss again. .  Social History   Socioeconomic History  . Marital status: Married    Spouse name: Not on file  . Number of children: Not on file  . Years of education: Not on file  . Highest education level: Not on file  Occupational History    Employer: [employer]  Social Needs  . Financial resource strain: Not on file  . Food insecurity:    Worry: Not on file    Inability: Not on file  . Transportation needs:    Medical: Not on file    Non-medical: Not on file  Tobacco Use  . Smoking status: Never Smoker  . Smokeless tobacco: Never Used  . Tobacco comment: never used tobacco  Substance and Sexual Activity  . Alcohol use: No  . Drug use: No  . Sexual activity: Yes    Partners: Male  Lifestyle  . Physical activity:    Days per week: Not on file    Minutes per session: Not on file  . Stress: Not on file  Relationships  . Social connections:    Talks on phone: Not on file    Gets together: Not on file    Attends religious service: Not on file    Active member of club or organization: Not on file    Attends meetings of clubs or organizations: Not on file    Relationship status: Not on file  . Intimate partner violence:    Fear of current or ex partner: Not on file    Emotionally abused: Not on file    Physically abused: Not on file    Forced sexual activity: Not on file  Other Topics Concern  . Not on file  Social History Narrative  . Not on file   Health Maintenance  Topic Date Due  . PAP SMEAR  01/09/2015  . INFLUENZA VACCINE  06/22/2018  . TETANUS/TDAP  01/09/2022  . HIV Screening  Completed    The following portions of  the patient's history were reviewed and updated as appropriate: allergies, current medications, past family history, past medical history, past social history, past surgical history and problem list.  Review of Systems Pertinent items noted in HPI and remainder of comprehensive ROS otherwise negative.   Objective:    BP 120/72   Pulse 66   Ht 5\' 5"  (1.651 m)   Wt 237 lb (107.5 kg)   BMI 39.44 kg/m  General appearance: alert, cooperative, appears stated age and moderately obese Head: Normocephalic, without obvious abnormality, atraumatic Eyes: conjunctivae/corneas clear. PERRL, EOM's intact. Fundi benign. Ears: normal TM's and external ear canals both ears Nose: Nares normal. Septum midline. Mucosa normal. No drainage or sinus tenderness. Throat: lips, mucosa, and tongue normal; teeth and gums normal Neck: no adenopathy, no carotid bruit, no JVD, supple, symmetrical, trachea midline and thyroid not enlarged, symmetric, no tenderness/mass/nodules Back: symmetric, no curvature. ROM normal. No CVA tenderness. Lungs: clear to auscultation bilaterally Breasts: normal appearance, no masses or tenderness Heart: regular rate and rhythm, S1, S2 normal, no murmur, click, rub or gallop Abdomen: soft, non-tender; bowel sounds normal; no masses,  no organomegaly Extremities: extremities normal, atraumatic, no cyanosis or edema Pulses: 2+ and symmetric Skin: Skin color, texture, turgor normal. No rashes or lesions bruising noted on arms, bilaterally.  Lymph nodes: Cervical, supraclavicular, and axillary nodes normal. Neurologic: Alert and oriented X 3, normal strength and tone. Normal symmetric reflexes. Normal coordination and gait    Assessment:    Healthy female exam.      Plan:    Marland KitchenMarland KitchenKyanne was seen today for annual exam.  Diagnoses and all orders for this visit:  Routine physical examination  History of ITP -     CBC w/Diff/Platelet -     Fe+TIBC+Fer  Acquired  hypothyroidism  Class 2 obesity due to excess calories without serious comorbidity with body mass index (BMI) of 39.0 to 39.9 in adult -     phentermine (ADIPEX-P) 37.5 MG tablet; Take 1 tablet (37.5 mg total) by mouth daily before breakfast.  Vitamin D deficiency  Other orders -     Discontinue: Vitamin D, Ergocalciferol, (DRISDOL) 50000 units CAPS capsule; Take 1 capsule (50,000 Units total) by mouth every 7 (seven) days. -     Discontinue: levothyroxine (SYNTHROID, LEVOTHROID) 25 MCG tablet; Take 1 tablet (25 mcg total) by mouth daily before breakfast.  .. Depression screen [facility] 2/9 03/02/2018 03/01/2014 01/22/2014 01/21/2014  Decreased Interest 0 0 0 0  Down, Depressed, Hopeless 0 0 0 0  PHQ - 2 Score 0 0 0 0   .[name] Discussed 150 minutes of exercise a week.  Encouraged vitamin D 1000 units and Calcium 1300mg  or 4 servings of dairy a day.  Vaccines up to date.  Need pap.   Will check CBC/plateletes due to bruising.   [name].Discussed low carb diet with 1500 calories and 80g of protein.  Exercising at least 150 minutes a week.  [employer] could be a Chief Technology Officer.  Discussed medication options. Will try phentermine again. Discussed side effects. Follow up in 1 month with nurse visit. Hx of trying belviq discussed we could also try this again.   Will keep synthroid the same certainly recheck in a few months to see which way it is trending.    See After Visit Summary for Counseling Recommendations

## 2018-03-06 NOTE — Telephone Encounter (Signed)
Thank you :)

## 2018-03-30 ENCOUNTER — Ambulatory Visit: Payer: BLUE CROSS/BLUE SHIELD

## 2018-03-31 ENCOUNTER — Other Ambulatory Visit: Payer: Self-pay | Admitting: Physician Assistant

## 2018-03-31 ENCOUNTER — Ambulatory Visit (INDEPENDENT_AMBULATORY_CARE_PROVIDER_SITE_OTHER): Payer: BLUE CROSS/BLUE SHIELD | Admitting: Physician Assistant

## 2018-03-31 VITALS — BP 111/54 | HR 50 | Resp 16 | Wt 229.0 lb

## 2018-03-31 DIAGNOSIS — Z6839 Body mass index (BMI) 39.0-39.9, adult: Secondary | ICD-10-CM

## 2018-03-31 DIAGNOSIS — E6609 Other obesity due to excess calories: Secondary | ICD-10-CM | POA: Diagnosis not present

## 2018-03-31 DIAGNOSIS — R635 Abnormal weight gain: Secondary | ICD-10-CM | POA: Diagnosis not present

## 2018-03-31 MED ORDER — ALBUTEROL SULFATE 0.63 MG/3ML IN NEBU: 1.0000 | INHALATION_SOLUTION | Freq: Four times a day (QID) | RESPIRATORY_TRACT | 11 refills | Status: DC | PRN

## 2018-03-31 MED ORDER — ALBUTEROL SULFATE HFA 108 (90 BASE) MCG/ACT IN AERS
2.0000 | INHALATION_SPRAY | Freq: Four times a day (QID) | RESPIRATORY_TRACT | 2 refills | Status: DC | PRN
Start: 2018-03-31 — End: 2018-07-16

## 2018-03-31 MED ORDER — PHENTERMINE HCL 37.5 MG PO TABS: 37.5000 mg | ORAL_TABLET | Freq: Every day | ORAL | 0 refills | Status: DC

## 2018-03-31 NOTE — Progress Notes (Signed)
Pt presents today for weight check. Denies and palpitations or does state she needs to drink more water due to dry mouth. Pt has lost 8lbs, a refill of phentermine will be sent to pharmacy. Pt advised to schedule a f/u nurse visit in 1 month.   Agree with above plan. Tandy Gaw PA-C

## 2018-04-03 NOTE — Telephone Encounter (Signed)
[facility].  They state "Pharmacy comment: Alternative Requested:TRIED PROVENTIL, PROAIR AND VENTOLIN AND ALL SAY NOT COVERED."  Per [employer], it appears RX will still cost around $90 with coupon.  Please advise

## 2018-04-03 NOTE — Telephone Encounter (Signed)
There are no other rescue inhalers that I know of. Does she have nebulizer machine? If so we could send over solution?

## 2018-04-07 NOTE — Telephone Encounter (Signed)
Yes

## 2018-04-07 NOTE — Telephone Encounter (Signed)
Pt had RX for ACCUNEB called in on 03-31-18, along with her inhaler. OK to advise pt to just use this PRN instead of using Proventil?

## 2018-04-07 NOTE — Telephone Encounter (Signed)
Pt advised. She will use Accuneb solution and will update [place] at next OV or sooner if needed.

## 2018-04-28 ENCOUNTER — Ambulatory Visit: Payer: BLUE CROSS/BLUE SHIELD

## 2018-05-09 DIAGNOSIS — D696 Thrombocytopenia, unspecified: Secondary | ICD-10-CM | POA: Diagnosis not present

## 2018-05-09 DIAGNOSIS — S60221A Contusion of right hand, initial encounter: Secondary | ICD-10-CM | POA: Diagnosis not present

## 2018-05-09 DIAGNOSIS — S5002XA Contusion of left elbow, initial encounter: Secondary | ICD-10-CM | POA: Diagnosis not present

## 2018-05-22 ENCOUNTER — Other Ambulatory Visit: Payer: Self-pay | Admitting: Physician Assistant

## 2018-07-16 ENCOUNTER — Other Ambulatory Visit: Payer: Self-pay | Admitting: Physician Assistant

## 2018-08-04 ENCOUNTER — Other Ambulatory Visit: Payer: Self-pay | Admitting: Physician Assistant

## 2018-08-04 NOTE — Telephone Encounter (Signed)
Need to check vitamin D. But I will send refill.

## 2018-08-20 ENCOUNTER — Other Ambulatory Visit: Payer: Self-pay | Admitting: Physician Assistant

## 2018-10-23 ENCOUNTER — Other Ambulatory Visit: Payer: Self-pay | Admitting: Physician Assistant

## 2018-10-24 NOTE — Telephone Encounter (Signed)
Ok for refill but would like to recheck vitamin D and see if could transition to oral vitamin D OTC.

## 2018-10-24 NOTE — Telephone Encounter (Signed)
Did you want patient on vitamin D long term?

## 2018-11-03 NOTE — Telephone Encounter (Signed)
I called patient and she reports she has moved out of town.

## 2018-12-14 ENCOUNTER — Other Ambulatory Visit: Payer: Self-pay | Admitting: Physician Assistant

## 2019-01-12 ENCOUNTER — Other Ambulatory Visit: Payer: Self-pay | Admitting: Physician Assistant

## 2019-02-05 ENCOUNTER — Other Ambulatory Visit: Payer: Self-pay | Admitting: Physician Assistant

## 2019-02-07 ENCOUNTER — Other Ambulatory Visit: Payer: Self-pay

## 2019-02-07 ENCOUNTER — Ambulatory Visit (INDEPENDENT_AMBULATORY_CARE_PROVIDER_SITE_OTHER): Payer: BLUE CROSS/BLUE SHIELD | Admitting: Family Medicine

## 2019-02-07 ENCOUNTER — Encounter: Payer: Self-pay | Admitting: Family Medicine

## 2019-02-07 VITALS — BP 121/83 | HR 96 | Temp 98.1°F | Ht 65.0 in | Wt 256.4 lb

## 2019-02-07 DIAGNOSIS — E039 Hypothyroidism, unspecified: Secondary | ICD-10-CM

## 2019-02-07 DIAGNOSIS — D693 Immune thrombocytopenic purpura: Secondary | ICD-10-CM

## 2019-02-07 DIAGNOSIS — Z8639 Personal history of other endocrine, nutritional and metabolic disease: Secondary | ICD-10-CM

## 2019-02-07 DIAGNOSIS — L2084 Intrinsic (allergic) eczema: Secondary | ICD-10-CM

## 2019-02-07 DIAGNOSIS — M940 Chondrocostal junction syndrome [Tietze]: Secondary | ICD-10-CM

## 2019-02-07 DIAGNOSIS — E559 Vitamin D deficiency, unspecified: Secondary | ICD-10-CM

## 2019-02-07 DIAGNOSIS — E785 Hyperlipidemia, unspecified: Secondary | ICD-10-CM

## 2019-02-07 DIAGNOSIS — E049 Nontoxic goiter, unspecified: Secondary | ICD-10-CM | POA: Diagnosis not present

## 2019-02-07 MED ORDER — NALTREXONE-BUPROPION HCL ER 8-90 MG PO TB12: ORAL_TABLET | ORAL | 0 refills | Status: DC

## 2019-02-07 NOTE — Progress Notes (Signed)
Patient: Chelsey Guzman, Female    DOB: January 31, 1981, 38 y.o.   MRN: [id number] Visit Date: 02/07/2019  Today's Provider: Lavon Paganini, MD   Chief Complaint  Patient presents with  . Establish Care   Subjective:    I, Tiburcio Pea, CMA, am acting as a scribe for Lavon Paganini, MD.    New Patient:  Chelsey Guzman is a 38 y.o. female who presents today to [place]. She was previously a patient at the [facility] in [place].  She feels fairly well. She reports exercising none. She reports she is sleeping well.  Abnormal pap smear in the past with a LEEP 11 yrs ago.  Normal pap smears since then.  Wants to see a GYN in the area.   Goiter.  Noted in 2013. Had bening enlargement on [place].  Is taking Synthroid 77mg daily with good compliance.  TSH has not been rechecked since then.  Low platelet counts noted incidentally at CPE at age 38  Saw Hem/Onc.  Had bone marrow biopsy in 11/2014 showed hypocellular marrow consistent with ITP. Lowest platelet count 108.  Was told by Hem/onc to f/u prn  Asthma: intermittent and exacerbated by exercise and URIs.  Using Albuterol prn, no controller meds.  URI 3 weeks ago and still has some dry cough and has sharp pain in center of chest - worse with cough, on and off throughout the day  Dupixent for severe eczema. Followed by [facility] - Dr MLeward Quan Weight gain - Was taking prednisone for eczema and gained more and more weight.  Also working from home and less active than previously.  Tried phentermine in the past and had significant weight gain after stopping.  -----------------------------------------------------------------   Review of Systems  Constitutional: Positive for fatigue.  HENT: Negative.   Eyes: Negative.   Respiratory: Positive for chest tightness.   Cardiovascular: Negative.   Gastrointestinal: Negative.   Endocrine: Negative.   Genitourinary: Negative.   Musculoskeletal:  Negative.   Skin: Negative.   Allergic/Immunologic: Positive for environmental allergies.  Neurological: Negative.   Hematological: Bruises/bleeds easily.  Psychiatric/Behavioral: Negative.     Social History      She  reports that she has never smoked. She has never used smokeless tobacco. She reports that she does not drink alcohol or use drugs.       Social History   Socioeconomic History  . Marital status: Married    Spouse name: Not on file  . Number of children: 2  . Years of education: Not on file  . Highest education level: Not on file  Occupational History  . 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 Financial resource strain: Not on file  . Food insecurity:    Worry: Not on file    Inability: Not on file  . Transportation needs:    Medical: Not on file    Non-medical: Not on file  Tobacco Use  . Smoking status: Never Smoker  . Smokeless tobacco: Never Used  . Tobacco comment: never used tobacco  Substance and Sexual Activity  . Alcohol use: No  . Drug use: No  . Sexual activity: Yes    Partners: Male    Birth control/protection: Surgical  Lifestyle  . Physical activity:    Days per week: Not on file    Minutes per session: Not on file  . Stress: Not on file  Relationships  . Social  connections:    Talks on phone: Not on file    Gets together: Not on file    Attends religious service: Not on file    Active member of club or organization: Not on file    Attends meetings of clubs or organizations: Not on file    Relationship status: Not on file  Other Topics Concern  . Not on file  Social History Narrative  . Not on file    Past Medical History:  Diagnosis Date  . Abnormal Pap smear    leep  . Acne 01/10/2012  . Allergy   . Asthma   . Class 2 obesity due to excess calories without serious comorbidity with body mass index (BMI) of 39.0 to 39.9 in adult 12/24/2013  . Clotting disorder ([place])   . Thyroid  enlargement 01/10/2012     Patient Active Problem List   Diagnosis Date Noted  . Acquired hypothyroidism 03/02/2018  . Eczema 07/20/2016  . History of non anemic vitamin B12 deficiency 08/19/2015  . Vitamin D deficiency 08/05/2015  . 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 Chronic ITP (idiopathic thrombocytopenia) (HCC) 01/27/2015  . Hyperlipidemia 01/22/2015  . Morbid obesity ([place]) 12/24/2013  . Abnormal Pap smear 01/10/2012  . Thyroid enlargement 01/10/2012  . Acne 01/10/2012    Past Surgical History:  Procedure Laterality Date  . btl  2009  . TONSILLECTOMY    . TUBAL LIGATION      Family History        Family Status  Relation Name Status  . Sister  Deceased  . Mother  Alive  . Father  Alive  . Brother  Alive  . Son  Alive  . MGM  Deceased  . MGF  Deceased  . PGM  Alive  . PGF  Deceased  . Son  Alive  . Neg Hx  (Not Specified)        Her family history includes Depression in her mother; Diabetes in her sister; Hypertension in her mother. There is no history of Colon cancer or Breast cancer.      No Known Allergies   Current Outpatient Medications:  .  Dupilumab (DUPIXENT) 300 MG/2ML SOSY, Inject into the skin., Disp: , Rfl:  .  levothyroxine (SYNTHROID, LEVOTHROID) 25 MCG tablet, TAKE 1 TABLET (25 MCG TOTAL) BY MOUTH DAILY BEFORE BREAKFAST., Disp: 30 tablet, Rfl: 5 .  triamcinolone cream (KENALOG) 0.1 %, Apply 1 application topically 2 (two) times daily., Disp: 80 g, Rfl: 1 .  VENTOLIN HFA 108 (90 Base) MCG/ACT inhaler, TAKE 2 PUFFS BY MOUTH EVERY 6 HOURS AS NEEDED FOR WHEEZE OR SHORTNESS OF BREATH, Disp: 18 Inhaler, Rfl: 2 .  Vitamin D, Ergocalciferol, (DRISDOL) 1.25 MG (50000 UT) CAPS capsule, TAKE 1 CAPSULE BY MOUTH EVERY 7 DAYS. APPOINTMENT MUST BE MADE FOR REFILLS., Disp: 4 capsule, Rfl: 1 .  Naltrexone-buPROPion HCl ER 8-90 MG TB12, Take 1 tab PO daily x7d, then 1 tab PO BID x7d, then 2 tabs qAM and 1 tab qPM x7d, then 2 tabs PO BID x7d, Disp: 70  tablet, Rfl: 0   Patient Care Team: Virginia Crews, MD as PCP - General (Family Medicine)    Objective:    Vitals: BP 121/83 (BP Location: Right Arm, Patient Position: Sitting, Cuff Size: Large)   Pulse 96   Temp 98.1 F (36.7 C) (Oral)   Ht '5\' 5"'  (1.651 m)   Wt 256 lb 6.4 oz (116.3 kg)   SpO2 99%  BMI 42.67 kg/m    Vitals:   02/07/19 1557  BP: 121/83  Pulse: 96  Temp: 98.1 F (36.7 C)  TempSrc: Oral  SpO2: 99%  Weight: 256 lb 6.4 oz (116.3 kg)  Height: '5\' 5"'  (1.651 m)     Physical Exam Vitals signs reviewed.  Constitutional:      General: She is not in acute distress.    Appearance: Normal appearance. She is well-developed. She is not diaphoretic.  HENT:     Head: Normocephalic and atraumatic.     Right Ear: Tympanic membrane, ear canal and external ear normal.     Left Ear: Tympanic membrane, ear canal and external ear normal.     Nose: Nose normal.     Mouth/Throat:     Mouth: Mucous membranes are moist.     Pharynx: Oropharynx is clear. No oropharyngeal exudate.  Eyes:     General: No scleral icterus.    Conjunctiva/sclera: Conjunctivae normal.     Pupils: Pupils are equal, round, and reactive to light.  Neck:     Musculoskeletal: Neck supple.     Thyroid: Thyromegaly present.  Cardiovascular:     Rate and Rhythm: Normal rate and regular rhythm.     Pulses: Normal pulses.     Heart sounds: Normal heart sounds. No murmur.  Pulmonary:     Effort: Pulmonary effort is normal. No respiratory distress.     Breath sounds: Normal breath sounds. No wheezing or rales.  Chest:     Chest wall: Tenderness (along costochondral joints) present.  Abdominal:     General: Bowel sounds are normal. There is no distension.     Palpations: Abdomen is soft.     Tenderness: There is no abdominal tenderness. There is no guarding or rebound.  Musculoskeletal:        General: No deformity.     Right lower leg: No edema.     Left lower leg: No edema.   Lymphadenopathy:     Cervical: No cervical adenopathy.  Skin:    General: Skin is warm and dry.     Capillary Refill: Capillary refill takes less than 2 seconds.     Findings: No rash.  Neurological:     Mental Status: She is alert and oriented to person, place, and time. Mental status is at baseline.  Psychiatric:        Mood and Affect: Mood normal.        Behavior: Behavior normal.        Thought Content: Thought content normal.      Depression Screen PHQ 2/9 Scores 02/07/2019 03/02/2018 03/01/2014 01/22/2014  PHQ - 2 Score 0 0 0 0  PHQ- 9 Score 2 - - -       Assessment & Plan:     Establish Care  Exercise Activities and Dietary recommendations Goals   None     Immunization History  Administered Date(s) Administered  . Tdap 01/10/2012    Health Maintenance  Topic Date Due  . INFLUENZA VACCINE  02/20/2019 (Originally 06/22/2018)  . PAP SMEAR-Modifier  08/23/2020  . TETANUS/TDAP  01/09/2022  . HIV Screening  Completed     Discussed health benefits of physical activity, and encouraged her to engage in regular exercise appropriate for her age and condition.    Abstracted info from [facility] and [facility] --------------------------------------------------------------------  Problem List Items Addressed This Visit      Endocrine   Thyroid enlargement (Chronic)    Stable goiter Benign enlargement noted  on thyroid ultrasound in the past Continue to monitor      Relevant Orders   TSH   Acquired hypothyroidism    Chronic and stable Patient is asymptomatic Continue Synthroid at current dose Recheck TSH      Relevant Orders   TSH     Musculoskeletal and Integument   Chronic ITP (idiopathic thrombocytopenia) (HCC) - Primary    Chronic and stable Recheck CBC Hematology referral if platelets drop below 100 again      Relevant Orders   CBC w/Diff/Platelet   Eczema    Severe, chronic Followed by Allergist Continue Dupixent      Costochondritis    Exam cw  costochondirits Discussed NSAIDs and natural course        Other   Morbid obesity ([place])    Discussed healthy weight management, diet, and exercise Will try contrave Failed phentermine in the past F/u in 3 months      Relevant Medications   Naltrexone-buPROPion HCl ER 8-90 MG TB12   Other Relevant Orders   Comprehensive metabolic panel   Hyperlipidemia    Recheck lipid panel Not on statin      Relevant Orders   Lipid panel   Comprehensive metabolic panel   Vitamin D deficiency    Recheck Vit D Continue supplement      Relevant Orders   VITAMIN D 25 Hydroxy (Vit-D Deficiency, Fractures)   History of non anemic vitamin B12 deficiency    Recheck B12      Relevant Orders   B12       Return in about 4 weeks (around 03/07/2019) for CPE and 4 month weight f/u.   The entirety of the information documented in the History of Present Illness, Review of Systems and Physical Exam were personally obtained by me. Portions of this information were initially documented by Tiburcio Pea, CMA and reviewed by me for thoroughness and accuracy.    Virginia Crews, MD, MPH [facility] 02/08/2019 9:35 AM

## 2019-02-07 NOTE — Patient Instructions (Addendum)
Fatigue If you have fatigue, you feel tired all the time and have a lack of energy or a lack of motivation. Fatigue may make it difficult to start or complete tasks because of exhaustion. In general, occasional or mild fatigue is often a normal response to activity or life. However, long-lasting (chronic) or extreme fatigue may be a symptom of a medical condition. Follow these instructions at home: General instructions  Watch your fatigue for any changes.  Go to bed and get up at the same time every day.  Avoid fatigue by pacing yourself during the day and getting enough sleep at night.  Maintain a healthy weight. Medicines  Take over-the-counter and prescription medicines only as told by your health care provider.  Take a multivitamin, if told by your health care provider.  Do not use herbal or dietary supplements unless they are approved by your health care provider. Activity   Exercise regularly, as told by your health care provider.  Use or practice techniques to help you relax, such as yoga, tai chi, meditation, or massage therapy. Eating and drinking   Avoid heavy meals in the evening.  Eat a well-balanced diet, which includes lean proteins, whole grains, plenty of fruits and vegetables, and low-fat dairy products.  Avoid consuming too much caffeine.  Avoid the use of alcohol.  Drink enough fluid to keep your urine pale yellow. Lifestyle  Change situations that cause you stress. Try to keep your work and personal schedule in balance.  Do not use any products that contain nicotine or tobacco, such as cigarettes and e-cigarettes. If you need help quitting, ask your health care provider.  Do not use drugs. Contact a health care provider if:  Your fatigue does not get better.  You have a fever.  You suddenly lose or gain weight.  You have headaches.  You have trouble falling asleep or sleeping through the night.  You feel angry, guilty, anxious, or sad.   You are unable to have a bowel movement (constipation).  Your skin is dry.  You have swelling in your legs or another part of your body. Get help right away if:  You feel confused.  Your vision is blurry.  You feel faint or you pass out.  You have a severe headache.  You have severe pain in your abdomen, your back, or the area between your waist and hips (pelvis).  You have chest pain, shortness of breath, or an irregular or fast heartbeat.  You are unable to urinate, or you urinate less than normal.  You have abnormal bleeding, such as bleeding from the rectum, vagina, nose, lungs, or nipples.  You vomit blood.  You have thoughts about hurting yourself or others. If you ever feel like you may hurt yourself or others, or have thoughts about taking your own life, get help right away. You can go to your nearest emergency department or call:  Your local emergency services (911 in the [place]  A suicide crisis helpline, such as the [place] at (505)831-7817. This is open 24 hours a day. Summary  If you have fatigue, you feel tired all the time and have a lack of energy or a lack of motivation.  Fatigue may make it difficult to start or complete tasks because of exhaustion.  Long-lasting (chronic) or extreme fatigue may be a symptom of a medical condition.  Exercise regularly, as told by your health care provider.  Change situations that cause you stress. Try to keep your  work and personal schedule in balance. This information is not intended to replace advice given to you by your health care provider. Make sure you discuss any questions you have with your health care provider. Document Released: 09/05/2007 Document Revised: 08/03/2017 Document Reviewed: 08/03/2017 Elsevier Interactive Patient Education  2019 Elsevier Inc.    Costochondritis  Costochondritis is swelling and irritation (inflammation) of the tissue (cartilage) that connects your  ribs to your breastbone (sternum). This causes pain in the front of your chest. The pain usually starts gradually and involves more than one rib. What are the causes? The exact cause of this condition is not always known. It results from stress on the cartilage where your ribs attach to your sternum. The cause of this stress could be:  Chest injury (trauma).  Exercise or activity, such as lifting.  Severe coughing. What increases the risk? You may be at higher risk for this condition if you:  Are female.  Are 65?36 years old.  Recently started a new exercise or work activity.  Have low levels of vitamin D.  Have a condition that makes you cough frequently. What are the signs or symptoms? The main symptom of this condition is chest pain. The pain:  Usually starts gradually and can be sharp or dull.  Gets worse with deep breathing, coughing, or exercise.  Gets better with rest.  May be worse when you press on the sternum-rib connection (tenderness). How is this diagnosed? This condition is diagnosed based on your symptoms, medical history, and a physical exam. Your health care provider will check for tenderness when pressing on your sternum. This is the most important finding. You may also have tests to rule out other causes of chest pain. These may include:  A chest X-ray to check for lung problems.  An electrocardiogram (ECG) to see if you have a heart problem that could be causing the pain.  An imaging scan to rule out a chest or rib fracture. How is this treated? This condition usually goes away on its own over time. Your health care provider may prescribe an NSAID to reduce pain and inflammation. Your health care provider may also suggest that you:  Rest and avoid activities that make pain worse.  Apply heat or cold to the area to reduce pain and inflammation.  Do exercises to stretch your chest muscles. If these treatments do not help, your health care provider may  inject a numbing medicine at the sternum-rib connection to help relieve the pain. Follow these instructions at home:  Avoid activities that make pain worse. This includes any activities that use chest, abdominal, and side muscles.  If directed, put ice on the painful area: ? Put ice in a plastic bag. ? Place a towel between your skin and the bag. ? Leave the ice on for 20 minutes, 2-3 times a day.  If directed, apply heat to the affected area as often as told by your health care provider. Use the heat source that your health care provider recommends, such as a moist heat pack or a heating pad. ? Place a towel between your skin and the heat source. ? Leave the heat on for 20-30 minutes. ? Remove the heat if your skin turns bright red. This is especially important if you are unable to feel pain, heat, or cold. You may have a greater risk of getting burned.  Take over-the-counter and prescription medicines only as told by your health care provider.  Return to your normal activities  as told by your health care provider. Ask your health care provider what activities are safe for you.  Keep all follow-up visits as told by your health care provider. This is important. Contact a health care provider if:  You have chills or a fever.  Your pain does not go away or it gets worse.  You have a cough that does not go away (is persistent). Get help right away if:  You have shortness of breath. This information is not intended to replace advice given to you by your health care provider. Make sure you discuss any questions you have with your health care provider. Document Released: 08/18/2005 Document Revised: 08/10/2017 Document Reviewed: 03/03/2016 Elsevier Interactive Patient Education  [employer].

## 2019-02-08 ENCOUNTER — Encounter: Payer: Self-pay | Admitting: Family Medicine

## 2019-02-08 DIAGNOSIS — E785 Hyperlipidemia, unspecified: Secondary | ICD-10-CM | POA: Diagnosis not present

## 2019-02-08 DIAGNOSIS — E049 Nontoxic goiter, unspecified: Secondary | ICD-10-CM | POA: Diagnosis not present

## 2019-02-08 DIAGNOSIS — E039 Hypothyroidism, unspecified: Secondary | ICD-10-CM | POA: Diagnosis not present

## 2019-02-08 DIAGNOSIS — M940 Chondrocostal junction syndrome [Tietze]: Secondary | ICD-10-CM | POA: Insufficient documentation

## 2019-02-08 DIAGNOSIS — E559 Vitamin D deficiency, unspecified: Secondary | ICD-10-CM | POA: Diagnosis not present

## 2019-02-08 DIAGNOSIS — D693 Immune thrombocytopenic purpura: Secondary | ICD-10-CM | POA: Diagnosis not present

## 2019-02-08 DIAGNOSIS — Z8639 Personal history of other endocrine, nutritional and metabolic disease: Secondary | ICD-10-CM | POA: Diagnosis not present

## 2019-02-08 NOTE — Assessment & Plan Note (Signed)
Chronic and stable Recheck CBC Hematology referral if platelets drop below 100 again 

## 2019-02-08 NOTE — Assessment & Plan Note (Signed)
Recheck lipid panel Not on statin 

## 2019-02-08 NOTE — Assessment & Plan Note (Signed)
Stable goiter Benign enlargement noted on thyroid ultrasound in the past Continue to monitor 

## 2019-02-08 NOTE — Assessment & Plan Note (Signed)
Chronic and stable Patient is asymptomatic Continue Synthroid at current dose Recheck TSH

## 2019-02-08 NOTE — Assessment & Plan Note (Signed)
Severe, chronic Followed by Allergist Continue Dupixent

## 2019-02-08 NOTE — Assessment & Plan Note (Signed)
Discussed healthy weight management, diet, and exercise Will try contrave Failed phentermine in the past F/u in 3 months

## 2019-02-08 NOTE — Assessment & Plan Note (Signed)
Recheck Vit D Continue supplement 

## 2019-02-08 NOTE — Assessment & Plan Note (Signed)
Recheck B12 

## 2019-02-08 NOTE — Assessment & Plan Note (Signed)
Exam cw costochondirits Discussed NSAIDs and natural course

## 2019-02-09 ENCOUNTER — Telehealth: Payer: Self-pay

## 2019-02-09 LAB — CBC WITH DIFFERENTIAL/PLATELET
Basophils Absolute: 0.1 10*3/uL (ref 0.0–0.2)
Basos: 1 %
EOS (ABSOLUTE): 0.2 10*3/uL (ref 0.0–0.4)
Eos: 3 %
Hematocrit: 39.2 % (ref 34.0–46.6)
Hemoglobin: 12.7 g/dL (ref 11.1–15.9)
Immature Grans (Abs): 0 10*3/uL (ref 0.0–0.1)
Immature Granulocytes: 0 %
Lymphocytes Absolute: 2.6 10*3/uL (ref 0.7–3.1)
Lymphs: 38 %
MCH: 28.7 pg (ref 26.6–33.0)
MCHC: 32.4 g/dL (ref 31.5–35.7)
MCV: 89 fL (ref 79–97)
Monocytes Absolute: 0.6 10*3/uL (ref 0.1–0.9)
Monocytes: 9 %
Neutrophils Absolute: 3.4 10*3/uL (ref 1.4–7.0)
Neutrophils: 49 %
Platelets: 150 10*3/uL (ref 150–450)
RBC: 4.42 x10E6/uL (ref 3.77–5.28)
RDW: 12.8 % (ref 11.7–15.4)
WBC: 6.8 10*3/uL (ref 3.4–10.8)

## 2019-02-09 LAB — COMPREHENSIVE METABOLIC PANEL
ALT: 8 IU/L (ref 0–32)
AST: 18 IU/L (ref 0–40)
Albumin/Globulin Ratio: 1.4 (ref 1.2–2.2)
Albumin: 4.2 g/dL (ref 3.8–4.8)
Alkaline Phosphatase: 80 IU/L (ref 39–117)
BUN/Creatinine Ratio: 18 (ref 9–23)
BUN: 16 mg/dL (ref 6–20)
Bilirubin Total: 0.2 mg/dL (ref 0.0–1.2)
CO2: 20 mmol/L (ref 20–29)
Calcium: 9.1 mg/dL (ref 8.7–10.2)
Chloride: 99 mmol/L (ref 96–106)
Creatinine, Ser: 0.87 mg/dL (ref 0.57–1.00)
GFR calc Af Amer: 100 mL/min/{1.73_m2} (ref >59)
GFR calc non Af Amer: 87 mL/min/{1.73_m2} (ref >59)
Globulin, Total: 2.9 g/dL (ref 1.5–4.5)
Glucose: 90 mg/dL (ref 65–99)
Potassium: 4.3 mmol/L (ref 3.5–5.2)
Sodium: 140 mmol/L (ref 134–144)
Total Protein: 7.1 g/dL (ref 6.0–8.5)

## 2019-02-09 LAB — LIPID PANEL
Chol/HDL Ratio: 4.6 ratio — ABNORMAL HIGH (ref 0.0–4.4)
Cholesterol, Total: 232 mg/dL — ABNORMAL HIGH (ref 100–199)
HDL: 50 mg/dL (ref >39)
LDL Calculated: 160 mg/dL — ABNORMAL HIGH (ref 0–99)
Triglycerides: 112 mg/dL (ref 0–149)
VLDL Cholesterol Cal: 22 mg/dL (ref 5–40)

## 2019-02-09 LAB — VITAMIN B12: Vitamin B-12: 516 pg/mL (ref 232–1245)

## 2019-02-09 LAB — TSH: TSH: 3.36 u[IU]/mL (ref 0.450–4.500)

## 2019-02-09 LAB — VITAMIN D 25 HYDROXY (VIT D DEFICIENCY, FRACTURES): Vit D, 25-Hydroxy: 24.4 ng/mL — ABNORMAL LOW (ref 30.0–100.0)

## 2019-02-09 NOTE — Telephone Encounter (Signed)
There was a recent study that suggested that daily supplement with 5000 units may be more beneficial.  We could try this or leave it the same

## 2019-02-09 NOTE — Telephone Encounter (Signed)
-----   Message from Erasmo Downer, MD sent at 02/09/2019  8:45 AM EDT ----- Normal labs, except low Vit d (taking supplement consistently?)  And high cholesterol (does not need statin, but recommend diet low in saturated fat and regular exercise - 30 min at least 5 times per week)

## 2019-02-09 NOTE — Telephone Encounter (Signed)
Patient returned call and was advised. 

## 2019-02-09 NOTE — Telephone Encounter (Signed)
Patient advised. She states she is taking Vitamin D 50,000 IU consistently.

## 2019-02-09 NOTE — Telephone Encounter (Signed)
Tried to contact patient but her phone did not have good connection.Waiting on patient to return call.

## 2019-02-26 ENCOUNTER — Other Ambulatory Visit: Payer: Self-pay | Admitting: Physician Assistant

## 2019-03-14 ENCOUNTER — Encounter: Payer: BLUE CROSS/BLUE SHIELD | Admitting: Family Medicine

## 2019-05-04 ENCOUNTER — Ambulatory Visit: Payer: BLUE CROSS/BLUE SHIELD | Admitting: Family Medicine

## 2019-05-31 ENCOUNTER — Encounter: Payer: Self-pay | Admitting: Family Medicine

## 2019-05-31 ENCOUNTER — Other Ambulatory Visit: Payer: Self-pay

## 2019-05-31 ENCOUNTER — Ambulatory Visit (INDEPENDENT_AMBULATORY_CARE_PROVIDER_SITE_OTHER): Payer: BLUE CROSS/BLUE SHIELD | Admitting: Family Medicine

## 2019-05-31 VITALS — BP 137/82 | HR 85 | Temp 99.3°F | Ht 65.0 in | Wt 264.6 lb

## 2019-05-31 DIAGNOSIS — Z0000 Encounter for general adult medical examination without abnormal findings: Secondary | ICD-10-CM | POA: Diagnosis not present

## 2019-05-31 NOTE — Progress Notes (Signed)
Patient: Chelsey Guzman, Female    DOB: 24-Jan-1981, 38 y.o.   MRN: [id number] Visit Date: 05/31/2019  Today's Provider: Lavon Paganini, MD   Chief Complaint  Patient presents with  . Annual Exam   Subjective:    Annual physical exam Chelsey Guzman is a 38 y.o. female who presents today for health maintenance and complete physical. She feels well. She reports exercising: walking. She reports she is sleeping well.  ----------------------------------------------------------------- Last pap:08/23/2017  Tried taking Contrave for weight loss Got increased nausea and headaches   Review of Systems  Constitutional: Negative.   HENT: Negative.   Eyes: Negative.   Respiratory: Positive for shortness of breath.   Cardiovascular: Negative.   Gastrointestinal: Negative.   Endocrine: Negative.   Genitourinary: Negative.   Musculoskeletal: Negative.   Skin: Negative.   Allergic/Immunologic: Negative.   Neurological: Negative.   Hematological: Bruises/bleeds easily.  Psychiatric/Behavioral: Negative.     Social History She  reports that she has never smoked. She has never used smokeless tobacco. She reports that she does not drink alcohol or use drugs. Social History   Socioeconomic History  . Marital status: Married    Spouse name: Not on file  . Number of children: 2  . Years of education: Not on file  . Highest education level: Not on file  Occupational History  . Occupation: Manufacturing systems engineer: [employer]  . Financial resource strain: Not on file  . Food insecurity    Worry: Not on file    Inability: Not on file  . Transportation needs    Medical: Not on file    Non-medical: Not on file  Tobacco Use  . Smoking status: Never Smoker  . Smokeless tobacco: Never Used  . Tobacco comment: never used tobacco  Substance and Sexual Activity  . Alcohol use: No  . Drug use: No  . Sexual activity: Yes    Partners: Male   Birth control/protection: Surgical  Lifestyle  . Physical activity    Days per week: Not on file    Minutes per session: Not on file  . Stress: Not on file  Relationships  . Social Herbalist on phone: Not on file    Gets together: Not on file    Attends religious service: Not on file    Active member of club or organization: Not on file    Attends meetings of clubs or organizations: Not on file    Relationship status: Not on file  Other Topics Concern  . Not on file  Social History Narrative  . Not on file    Patient Active Problem List   Diagnosis Date Noted  . Costochondritis 02/08/2019  . Acquired hypothyroidism 03/02/2018  . Eczema 07/20/2016  . History of non anemic vitamin B12 deficiency 08/19/2015  . Vitamin D deficiency 08/05/2015  . 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 Chronic ITP (idiopathic thrombocytopenia) (HCC) 01/27/2015  . Hyperlipidemia 01/22/2015  . Morbid obesity ([place]) 12/24/2013  . Abnormal Pap smear 01/10/2012  . Thyroid enlargement 01/10/2012  . Acne 01/10/2012    Past Surgical History:  Procedure Laterality Date  . btl  2009  . TONSILLECTOMY    . TUBAL LIGATION      Family History  Family Status  Relation Name Status  . Sister  Deceased  . Mother  Alive  . Father  Alive  . Brother  Alive  . Son  Alive  . MGM  Deceased  . MGF  Deceased  . PGM  Alive  . PGF  Deceased  . Son  Alive  . Neg Hx  (Not Specified)   Her family history includes Depression in her mother; Diabetes in her sister; Hypertension in her mother.     No Known Allergies  Previous Medications   DUPILUMAB (DUPIXENT) 300 MG/2ML SOSY    Inject into the skin.   LEVOTHYROXINE (SYNTHROID, LEVOTHROID) 25 MCG TABLET    TAKE 1 TABLET (25 MCG TOTAL) BY MOUTH DAILY BEFORE BREAKFAST.   NALTREXONE-BUPROPION HCL ER 8-90 MG TB12    Take 1 tab PO daily x7d, then 1 tab PO BID x7d, then 2 tabs qAM and 1 tab qPM x7d, then 2 tabs PO BID x7d   TRIAMCINOLONE CREAM  (KENALOG) 0.1 %    Apply 1 application topically 2 (two) times daily.   VENTOLIN HFA 108 (90 BASE) MCG/ACT INHALER    TAKE 2 PUFFS BY MOUTH EVERY 6 HOURS AS NEEDED FOR WHEEZE OR SHORTNESS OF BREATH   VITAMIN D, ERGOCALCIFEROL, (DRISDOL) 1.25 MG (50000 UT) CAPS CAPSULE    TAKE 1 CAPSULE BY MOUTH EVERY 7 DAYS. APPOINTMENT MUST BE MADE FOR REFILLS.    Patient Care Team: Erasmo DownerBacigalupo, Jodey Burbano M, MD as PCP - General (Family Medicine)      Objective:   Vitals: BP 137/82 (BP Location: Left Arm, Patient Position: Sitting, Cuff Size: Normal)   Pulse (!) 102   Temp 99.3 F (37.4 C) (Oral)   Ht 5\' 5"  (1.651 m)   Wt 264 lb 9.6 oz (120 kg)   LMP 05/21/2019   SpO2 99%   BMI 44.03 kg/m    Physical Exam   Depression Screen PHQ 2/9 Scores 02/07/2019 03/02/2018 03/01/2014 01/22/2014  PHQ - 2 Score 0 0 0 0  PHQ- 9 Score 2 - - -    Assessment & Plan:     Routine Health Maintenance and Physical Exam  Exercise Activities and Dietary recommendations Goals   None     Immunization History  Administered Date(s) Administered  . Tdap 01/10/2012    Health Maintenance  Topic Date Due  . INFLUENZA VACCINE  06/23/2019  . PAP SMEAR-Modifier  08/23/2020  . TETANUS/TDAP  01/09/2022  . HIV Screening  Completed     Discussed health benefits of physical activity, and encouraged her to engage in regular exercise appropriate for her age and condition.    --------------------------------------------------------------------  Problem List Items Addressed This Visit      Other   Morbid obesity (HCC)    Discussed healthy weight meanagement, diet and exercise Discussed intermittent fasting Did not tolerate contrave Failed phentermine in the past Consider weight management referral in the future       Other Visit Diagnoses    Encounter for annual physical exam    -  Primary       Return in about 6 months (around 12/01/2019) for chronic disease f/u.   The entirety of the information documented  in the History of Present Illness, Review of Systems and Physical Exam were personally obtained by me. Portions of this information were initially documented by [facility] McClurkin, CMA and reviewed by me for thoroughness and accuracy.    Samaia Iwata, Marzella SchleinAngela M, MD MPH [facility]

## 2019-05-31 NOTE — Patient Instructions (Signed)
Preventive Care 21-36 Years Old, Female Preventive care refers to visits with your health care provider and lifestyle choices that can promote health and wellness. This includes:  A yearly physical exam. This may also be called an annual well check.  Regular dental visits and eye exams.  Immunizations.  Screening for certain conditions.  Healthy lifestyle choices, such as eating a healthy diet, getting regular exercise, not using drugs or products that contain nicotine and tobacco, and limiting alcohol use. What can I expect for my preventive care visit? Physical exam Your health care provider will check your:  Height and weight. This may be used to calculate body mass index (BMI), which tells if you are at a healthy weight.  Heart rate and blood pressure.  Skin for abnormal spots. Counseling Your health care provider may ask you questions about your:  Alcohol, tobacco, and drug use.  Emotional well-being.  Home and relationship well-being.  Sexual activity.  Eating habits.  Work and work environment.  Method of birth control.  Menstrual cycle.  Pregnancy history. What immunizations do I need?  Influenza (flu) vaccine  This is recommended every year. Tetanus, diphtheria, and pertussis (Tdap) vaccine  You may need a Td booster every 10 years. Varicella (chickenpox) vaccine  You may need this if you have not been vaccinated. Human papillomavirus (HPV) vaccine  If recommended by your health care provider, you may need three doses over 6 months. Measles, mumps, and rubella (MMR) vaccine  You may need at least one dose of MMR. You may also need a second dose. Meningococcal conjugate (MenACWY) vaccine  One dose is recommended if you are age 19-21 years and a first-year college student living in a residence hall, or if you have one of several medical conditions. You may also need additional booster doses. Pneumococcal conjugate (PCV13) vaccine  You may need  this if you have certain conditions and were not previously vaccinated. Pneumococcal polysaccharide (PPSV23) vaccine  You may need one or two doses if you smoke cigarettes or if you have certain conditions. Hepatitis A vaccine  You may need this if you have certain conditions or if you travel or work in places where you may be exposed to hepatitis A. Hepatitis B vaccine  You may need this if you have certain conditions or if you travel or work in places where you may be exposed to hepatitis B. Haemophilus influenzae type b (Hib) vaccine  You may need this if you have certain conditions. You may receive vaccines as individual doses or as more than one vaccine together in one shot (combination vaccines). Talk with your health care provider about the risks and benefits of combination vaccines. What tests do I need?  Blood tests  Lipid and cholesterol levels. These may be checked every 5 years starting at age 20.  Hepatitis C test.  Hepatitis B test. Screening  Diabetes screening. This is done by checking your blood sugar (glucose) after you have not eaten for a while (fasting).  Sexually transmitted disease (STD) testing.  BRCA-related cancer screening. This may be done if you have a family history of breast, ovarian, tubal, or peritoneal cancers.  Pelvic exam and Pap test. This may be done every 3 years starting at age 21. Starting at age 30, this may be done every 5 years if you have a Pap test in combination with an HPV test. Talk with your health care provider about your test results, treatment options, and if necessary, the need for more tests.   Follow these instructions at home: Eating and drinking   Eat a diet that includes fresh fruits and vegetables, whole grains, lean protein, and low-fat dairy.  Take vitamin and mineral supplements as recommended by your health care provider.  Do not drink alcohol if: ? Your health care provider tells you not to drink. ? You are  pregnant, may be pregnant, or are planning to become pregnant.  If you drink alcohol: ? Limit how much you have to 0-1 drink a day. ? Be aware of how much alcohol is in your drink. In the [place] one drink equals one 12 oz bottle of beer (355 mL), one 5 oz glass of wine (148 mL), or one 1 oz glass of hard liquor (44 mL). Lifestyle  Take daily care of your teeth and gums.  Stay active. Exercise for at least 30 minutes on 5 or more days each week.  Do not use any products that contain nicotine or tobacco, such as cigarettes, e-cigarettes, and chewing tobacco. If you need help quitting, ask your health care provider.  If you are sexually active, practice safe sex. Use a condom or other form of birth control (contraception) in order to prevent pregnancy and STIs (sexually transmitted infections). If you plan to become pregnant, see your health care provider for a preconception visit. What's next?  Visit your health care provider once a year for a well check visit.  Ask your health care provider how often you should have your eyes and teeth checked.  Stay up to date on all vaccines. This information is not intended to replace advice given to you by your health care provider. Make sure you discuss any questions you have with your health care provider. Document Released: 01/04/2002 Document Revised: 07/20/2018 Document Reviewed: 07/20/2018 Elsevier Patient Education  2020 Elsevier Inc.  

## 2019-05-31 NOTE — Assessment & Plan Note (Signed)
Discussed healthy weight meanagement, diet and exercise Discussed intermittent fasting Did not tolerate contrave Failed phentermine in the past Consider weight management referral in the future

## 2019-07-04 DIAGNOSIS — N951 Menopausal and female climacteric states: Secondary | ICD-10-CM | POA: Diagnosis not present

## 2019-07-04 DIAGNOSIS — E039 Hypothyroidism, unspecified: Secondary | ICD-10-CM | POA: Diagnosis not present

## 2019-07-09 DIAGNOSIS — N951 Menopausal and female climacteric states: Secondary | ICD-10-CM | POA: Diagnosis not present

## 2019-07-09 DIAGNOSIS — R6882 Decreased libido: Secondary | ICD-10-CM | POA: Diagnosis not present

## 2019-07-09 DIAGNOSIS — G479 Sleep disorder, unspecified: Secondary | ICD-10-CM | POA: Diagnosis not present

## 2019-07-09 DIAGNOSIS — E039 Hypothyroidism, unspecified: Secondary | ICD-10-CM | POA: Diagnosis not present

## 2019-08-11 ENCOUNTER — Other Ambulatory Visit: Payer: Self-pay | Admitting: Physician Assistant

## 2019-08-13 DIAGNOSIS — N951 Menopausal and female climacteric states: Secondary | ICD-10-CM | POA: Diagnosis not present

## 2019-08-13 DIAGNOSIS — G479 Sleep disorder, unspecified: Secondary | ICD-10-CM | POA: Diagnosis not present

## 2019-08-13 DIAGNOSIS — E039 Hypothyroidism, unspecified: Secondary | ICD-10-CM | POA: Diagnosis not present

## 2019-08-13 DIAGNOSIS — R6882 Decreased libido: Secondary | ICD-10-CM | POA: Diagnosis not present

## 2019-08-20 DIAGNOSIS — G479 Sleep disorder, unspecified: Secondary | ICD-10-CM | POA: Diagnosis not present

## 2019-08-20 DIAGNOSIS — E039 Hypothyroidism, unspecified: Secondary | ICD-10-CM | POA: Diagnosis not present

## 2019-08-20 DIAGNOSIS — N951 Menopausal and female climacteric states: Secondary | ICD-10-CM | POA: Diagnosis not present

## 2019-09-26 ENCOUNTER — Encounter: Payer: Self-pay | Admitting: Family Medicine

## 2019-09-27 ENCOUNTER — Other Ambulatory Visit: Payer: Self-pay

## 2019-09-27 MED ORDER — LEVOTHYROXINE SODIUM 25 MCG PO TABS: 25.0000 ug | ORAL_TABLET | Freq: Every day | ORAL | 5 refills | Status: DC

## 2019-12-03 ENCOUNTER — Other Ambulatory Visit: Payer: Self-pay

## 2019-12-03 ENCOUNTER — Ambulatory Visit (INDEPENDENT_AMBULATORY_CARE_PROVIDER_SITE_OTHER): Payer: BLUE CROSS/BLUE SHIELD | Admitting: Family Medicine

## 2019-12-03 ENCOUNTER — Encounter: Payer: Self-pay | Admitting: Family Medicine

## 2019-12-03 VITALS — BP 123/81 | HR 96 | Temp 96.9°F | Wt 260.0 lb

## 2019-12-03 DIAGNOSIS — D693 Immune thrombocytopenic purpura: Secondary | ICD-10-CM | POA: Diagnosis not present

## 2019-12-03 DIAGNOSIS — E039 Hypothyroidism, unspecified: Secondary | ICD-10-CM

## 2019-12-03 DIAGNOSIS — E785 Hyperlipidemia, unspecified: Secondary | ICD-10-CM | POA: Diagnosis not present

## 2019-12-03 DIAGNOSIS — E049 Nontoxic goiter, unspecified: Secondary | ICD-10-CM

## 2019-12-03 DIAGNOSIS — E559 Vitamin D deficiency, unspecified: Secondary | ICD-10-CM

## 2019-12-03 DIAGNOSIS — R1031 Right lower quadrant pain: Secondary | ICD-10-CM

## 2019-12-03 NOTE — Progress Notes (Signed)
Patient: Chelsey Guzman Female    DOB: March 17, 1981   39 y.o.   MRN: [id number] Visit Date: 12/03/2019  Today's Provider: Lavon Paganini, MD   No chief complaint on file.  Subjective:     Thyroid Problem Presents for follow-up visit. Patient reports no cold intolerance, constipation, diarrhea, dry skin, fatigue, heat intolerance, hoarse voice, nail problem, visual change, weight gain or weight loss. The symptoms have been stable.   States that she went to a hormone replacement clinic in [place] Theodis Shove MD) and was recommended to take Armour Thyroid instead of Synthroid.   She reports right lower quadrant abdominal pain x4 to 6 weeks.  States is getting better and she rarely feels it now.  It was a sharp and stabbing pain that was worse with certain movements.  She did not have any fever, nausea, vomiting, diarrhea.  She did not have it evaluated during this time.  She has not taken anything for the pain.  She became nervous when she looked it up on the Internet and found lots of scary things that could be wrong with her.   No Known Allergies   Current Outpatient Medications:  .  Dupilumab (DUPIXENT) 300 MG/2ML SOSY, Inject into the skin., Disp: , Rfl:  .  levothyroxine (SYNTHROID) 25 MCG tablet, Take 1 tablet (25 mcg total) by mouth daily before breakfast., Disp: 30 tablet, Rfl: 5 .  Naltrexone-buPROPion HCl ER 8-90 MG TB12, Take 1 tab PO daily x7d, then 1 tab PO BID x7d, then 2 tabs qAM and 1 tab qPM x7d, then 2 tabs PO BID x7d, Disp: 70 tablet, Rfl: 0 .  triamcinolone cream (KENALOG) 0.1 %, Apply 1 application topically 2 (two) times daily., Disp: 80 g, Rfl: 1 .  VENTOLIN HFA 108 (90 Base) MCG/ACT inhaler, TAKE 2 PUFFS BY MOUTH EVERY 6 HOURS AS NEEDED FOR WHEEZE OR SHORTNESS OF BREATH, Disp: 18 Inhaler, Rfl: 2 .  Vitamin D, Ergocalciferol, (DRISDOL) 1.25 MG (50000 UT) CAPS capsule, TAKE 1 CAPSULE BY MOUTH EVERY 7 DAYS. APPOINTMENT MUST BE MADE FOR REFILLS., Disp: 4  capsule, Rfl: 1  Review of Systems  Constitutional: Negative.  Negative for fatigue, weight gain and weight loss.  HENT: Negative for hoarse voice.   Respiratory: Negative.   Cardiovascular: Negative.   Gastrointestinal: Negative.  Negative for constipation and diarrhea.  Endocrine: Negative.  Negative for cold intolerance and heat intolerance.  Neurological: Negative for dizziness, light-headedness and headaches.    Social History   Tobacco Use  . Smoking status: Never Smoker  . Smokeless tobacco: Never Used  . Tobacco comment: never used tobacco  Substance Use Topics  . Alcohol use: No      Objective:   There were no vitals taken for this visit. There were no vitals filed for this visit.There is no height or weight on file to calculate BMI.   Physical Exam Vitals reviewed.  Constitutional:      General: She is not in acute distress.    Appearance: Normal appearance. She is well-developed. She is not diaphoretic.  HENT:     Head: Normocephalic and atraumatic.  Eyes:     General: No scleral icterus.    Conjunctiva/sclera: Conjunctivae normal.  Neck:     Thyroid: Thyromegaly present. No thyroid mass or thyroid tenderness.  Cardiovascular:     Rate and Rhythm: Normal rate and regular rhythm.     Pulses: Normal pulses.     Heart sounds: Normal heart sounds. No  murmur.  Pulmonary:     Effort: Pulmonary effort is normal. No respiratory distress.     Breath sounds: Normal breath sounds. No wheezing, rhonchi or rales.  Abdominal:     General: Bowel sounds are normal. There is no distension.     Palpations: Abdomen is soft. There is no mass.     Tenderness: There is abdominal tenderness (RLQ). There is no right CVA tenderness, left CVA tenderness, guarding or rebound.  Musculoskeletal:     Cervical back: Neck supple.     Right lower leg: No edema.     Left lower leg: No edema.  Lymphadenopathy:     Cervical: No cervical adenopathy.  Skin:    General: Skin is warm and  dry.     Capillary Refill: Capillary refill takes less than 2 seconds.     Findings: No rash.  Neurological:     Mental Status: She is alert and oriented to person, place, and time. Mental status is at baseline.  Psychiatric:        Mood and Affect: Mood normal.        Behavior: Behavior normal.      No results found for any visits on 12/03/19.     Assessment & Plan    Problem List Items Addressed This Visit      Endocrine   Thyroid enlargement (Chronic)    Stable goiter Benign enlargement noted on thyroid ultrasound in the past Continue to monitor      Acquired hypothyroidism - Primary    Chronic and stable Asymptomatic Continue Synthroid at current dose Discussed the difference in T3 and T4 in my view Synthroid and not Armour Thyroid Recheck TSH      Relevant Orders   TSH     Musculoskeletal and Integument   Chronic ITP (idiopathic thrombocytopenia) (HCC)    Chronic and stable Recheck CBC Hematology referral if platelets drop below 100 again      Relevant Orders   CBC     Other   Morbid obesity (HCC)    Discussed importance of healthy weight management Discussed diet and exercise       Hyperlipidemia    Recheck lipid panel Not on statin Lifestyle management      Relevant Orders   CMP (Comprehensive metabolic panel)   Lipid panel   Vitamin D deficiency    Finished 59-month course of high-dose supplement Recheck vitamin D level      Relevant Orders   Vit D  25 hydroxy   RLQ abdominal pain    New problem She is tender in her right lower quadrant on exam, but no rebound or guarding She reports that the pain has mostly resolved, and is triggered with movement No peritoneal signs We discussed that some of her symptoms are consistent with appendicitis, but she does not have any fever, GI symptoms, and the time course does not fit acute appendicitis Could represent MSK pain As she has improved, we will not seek any imaging today We are checking  CBC, CMP with labs today Discussed strict return precautions If right lower quadrant pain worsens or she develops any rebound/guarding or peritoneal signs, she will need CT abdomen pelvis          Return in about 6 months (around 06/01/2020) for CPE.   The entirety of the information documented in the History of Present Illness, Review of Systems and Physical Exam were personally obtained by me. Portions of this information were initially documented by Vernona Rieger  Clent Ridges, CMA and reviewed by me for thoroughness and accuracy.    Elisha Mcgruder, Marzella Schlein, MD MPH [facility]

## 2019-12-03 NOTE — Assessment & Plan Note (Signed)
Discussed importance of healthy weight management Discussed diet and exercise  

## 2019-12-03 NOTE — Assessment & Plan Note (Signed)
Chronic and stable Asymptomatic Continue Synthroid at current dose Discussed the difference in T3 and T4 in my view Synthroid and not Armour Thyroid Recheck TSH

## 2019-12-03 NOTE — Assessment & Plan Note (Signed)
Recheck lipid panel Not on statin Lifestyle management

## 2019-12-03 NOTE — Assessment & Plan Note (Signed)
Finished 26-month course of high-dose supplement Recheck vitamin D level

## 2019-12-03 NOTE — Assessment & Plan Note (Signed)
Chronic and stable Recheck CBC Hematology referral if platelets drop below 100 again

## 2019-12-03 NOTE — Assessment & Plan Note (Signed)
New problem She is tender in her right lower quadrant on exam, but no rebound or guarding She reports that the pain has mostly resolved, and is triggered with movement No peritoneal signs We discussed that some of her symptoms are consistent with appendicitis, but she does not have any fever, GI symptoms, and the time course does not fit acute appendicitis Could represent MSK pain As she has improved, we will not seek any imaging today We are checking CBC, CMP with labs today Discussed strict return precautions If right lower quadrant pain worsens or she develops any rebound/guarding or peritoneal signs, she will need CT abdomen pelvis

## 2019-12-03 NOTE — Patient Instructions (Signed)

## 2019-12-03 NOTE — Assessment & Plan Note (Signed)
Stable goiter Benign enlargement noted on thyroid ultrasound in the past Continue to monitor

## 2019-12-04 DIAGNOSIS — R739 Hyperglycemia, unspecified: Secondary | ICD-10-CM | POA: Diagnosis not present

## 2019-12-04 DIAGNOSIS — E559 Vitamin D deficiency, unspecified: Secondary | ICD-10-CM | POA: Diagnosis not present

## 2019-12-04 DIAGNOSIS — E785 Hyperlipidemia, unspecified: Secondary | ICD-10-CM | POA: Diagnosis not present

## 2019-12-04 DIAGNOSIS — E039 Hypothyroidism, unspecified: Secondary | ICD-10-CM | POA: Diagnosis not present

## 2019-12-04 DIAGNOSIS — D693 Immune thrombocytopenic purpura: Secondary | ICD-10-CM | POA: Diagnosis not present

## 2019-12-05 LAB — COMPREHENSIVE METABOLIC PANEL
ALT: 9 IU/L (ref 0–32)
AST: 20 IU/L (ref 0–40)
Albumin/Globulin Ratio: 1.4 (ref 1.2–2.2)
Albumin: 4.3 g/dL (ref 3.8–4.8)
Alkaline Phosphatase: 88 IU/L (ref 39–117)
BUN/Creatinine Ratio: 16 (ref 9–23)
BUN: 15 mg/dL (ref 6–20)
Bilirubin Total: 0.3 mg/dL (ref 0.0–1.2)
CO2: 22 mmol/L (ref 20–29)
Calcium: 9.3 mg/dL (ref 8.7–10.2)
Chloride: 102 mmol/L (ref 96–106)
Creatinine, Ser: 0.91 mg/dL (ref 0.57–1.00)
GFR calc Af Amer: 94 mL/min/{1.73_m2} (ref >59)
GFR calc non Af Amer: 81 mL/min/{1.73_m2} (ref >59)
Globulin, Total: 3 g/dL (ref 1.5–4.5)
Glucose: 100 mg/dL — ABNORMAL HIGH (ref 65–99)
Potassium: 4.4 mmol/L (ref 3.5–5.2)
Sodium: 139 mmol/L (ref 134–144)
Total Protein: 7.3 g/dL (ref 6.0–8.5)

## 2019-12-05 LAB — CBC
Hematocrit: 40.5 % (ref 34.0–46.6)
Hemoglobin: 13.7 g/dL (ref 11.1–15.9)
MCH: 29.6 pg (ref 26.6–33.0)
MCHC: 33.8 g/dL (ref 31.5–35.7)
MCV: 88 fL (ref 79–97)
Platelets: 150 10*3/uL (ref 150–450)
RBC: 4.63 x10E6/uL (ref 3.77–5.28)
RDW: 12.7 % (ref 11.7–15.4)
WBC: 6.9 10*3/uL (ref 3.4–10.8)

## 2019-12-05 LAB — LIPID PANEL
Chol/HDL Ratio: 4.7 ratio — ABNORMAL HIGH (ref 0.0–4.4)
Cholesterol, Total: 228 mg/dL — ABNORMAL HIGH (ref 100–199)
HDL: 49 mg/dL (ref >39)
LDL Chol Calc (NIH): 164 mg/dL — ABNORMAL HIGH (ref 0–99)
Triglycerides: 85 mg/dL (ref 0–149)
VLDL Cholesterol Cal: 15 mg/dL (ref 5–40)

## 2019-12-05 LAB — VITAMIN D 25 HYDROXY (VIT D DEFICIENCY, FRACTURES): Vit D, 25-Hydroxy: 44.3 ng/mL (ref 30.0–100.0)

## 2019-12-05 LAB — TSH: TSH: 3.93 u[IU]/mL (ref 0.450–4.500)

## 2019-12-06 ENCOUNTER — Telehealth: Payer: Self-pay

## 2019-12-06 NOTE — Telephone Encounter (Signed)
Seen by patient Chelsey Guzman on 12/06/2019 8:10 AM EST

## 2019-12-06 NOTE — Telephone Encounter (Signed)
-----   Message from Erasmo Downer, MD sent at 12/06/2019  8:10 AM EST ----- Normal Blood counts, kidney function, liver function, electrolytes, Vit D, Thyroid function.  Cholesterol is high.  No indication for a medication at this time, but I recommend diet low in saturated fat and regular exercise - 30 min at least 5 times per week.  Blood sugar is slightly high if patient was fasting.  Can we add on an A1c?

## 2019-12-10 ENCOUNTER — Telehealth: Payer: Self-pay

## 2019-12-10 LAB — SPECIMEN STATUS REPORT

## 2019-12-10 LAB — HGB A1C W/O EAG: Hgb A1c MFr Bld: 5.5 % (ref 4.8–5.6)

## 2019-12-10 NOTE — Telephone Encounter (Signed)
-----   Message from Erasmo Downer, MD sent at 12/07/2019  4:55 PM EST ----- Normal A1c. No prediabetes or diabets

## 2019-12-10 NOTE — Telephone Encounter (Signed)
Pt advised.

## 2019-12-10 NOTE — Telephone Encounter (Signed)
Comment seen by patient Chelsey Guzman on 12/07/2019 5:16 PM EST

## 2020-01-23 DIAGNOSIS — J3089 Other allergic rhinitis: Secondary | ICD-10-CM | POA: Diagnosis not present

## 2020-01-23 DIAGNOSIS — J301 Allergic rhinitis due to pollen: Secondary | ICD-10-CM | POA: Diagnosis not present

## 2020-01-23 DIAGNOSIS — J3081 Allergic rhinitis due to animal (cat) (dog) hair and dander: Secondary | ICD-10-CM | POA: Diagnosis not present

## 2020-01-23 DIAGNOSIS — L209 Atopic dermatitis, unspecified: Secondary | ICD-10-CM | POA: Diagnosis not present

## 2020-03-14 ENCOUNTER — Other Ambulatory Visit: Payer: Self-pay | Admitting: Family Medicine

## 2020-04-20 ENCOUNTER — Encounter: Payer: Self-pay | Admitting: Family Medicine

## 2020-04-20 DIAGNOSIS — Z03818 Encounter for observation for suspected exposure to other biological agents ruled out: Secondary | ICD-10-CM | POA: Diagnosis not present

## 2020-04-20 DIAGNOSIS — U071 COVID-19: Secondary | ICD-10-CM | POA: Diagnosis not present

## 2020-04-20 DIAGNOSIS — Z20828 Contact with and (suspected) exposure to other viral communicable diseases: Secondary | ICD-10-CM | POA: Diagnosis not present

## 2020-04-22 MED ORDER — ALBUTEROL SULFATE (2.5 MG/3ML) 0.083% IN NEBU: 2.5000 mg | INHALATION_SOLUTION | Freq: Four times a day (QID) | RESPIRATORY_TRACT | 1 refills | Status: DC | PRN

## 2020-04-22 NOTE — Telephone Encounter (Signed)
Ok to send albuterol neb solution to pharmacy.  Please advise patient that if albuterol is not helping with tightness/SOB, she needs to be evaluated in person at [facility].

## 2020-06-05 ENCOUNTER — Encounter: Payer: BLUE CROSS/BLUE SHIELD | Admitting: Family Medicine

## 2020-06-06 ENCOUNTER — Ambulatory Visit (INDEPENDENT_AMBULATORY_CARE_PROVIDER_SITE_OTHER): Payer: BLUE CROSS/BLUE SHIELD | Admitting: Physician Assistant

## 2020-06-06 ENCOUNTER — Encounter: Payer: Self-pay | Admitting: Physician Assistant

## 2020-06-06 ENCOUNTER — Other Ambulatory Visit: Payer: Self-pay

## 2020-06-06 VITALS — BP 118/78 | HR 89 | Temp 96.9°F | Ht 65.0 in | Wt 261.2 lb

## 2020-06-06 DIAGNOSIS — Z0000 Encounter for general adult medical examination without abnormal findings: Secondary | ICD-10-CM | POA: Diagnosis not present

## 2020-06-06 DIAGNOSIS — D693 Immune thrombocytopenic purpura: Secondary | ICD-10-CM

## 2020-06-06 NOTE — Progress Notes (Signed)
Complete physical exam   Patient: Chelsey Guzman   DOB: January 19, 1981   39 y.o. Female  MRN: [id number] Visit Date: 06/06/2020  Today's healthcare provider: Trey Sailors, PA-C   Chief Complaint  Patient presents with  . Annual Exam  I,Porsha C McClurkin,acting as a scribe for Trey Sailors, PA-C.,have documented all relevant documentation on the behalf of Trey Sailors, PA-C,as directed by  Trey Sailors, PA-C while in the presence of Trey Sailors, PA-C.  Subjective    Chelsey Guzman is a 39 y.o. female who presents today for a complete physical exam.  She reports consuming a general diet. Home exercise routine includes walk and bike. She generally feels well. She reports sleeping well. She does not have additional problems to discuss today.  HPI   History of ITP. Has not needed transufusions or interventions otherwise. Checks CBC routinely.   Morbid Obesity: Reports weight gain due to steroids. Declines nutritionist. Previously on phentermine but has gained weight back.   Past Medical History:  Diagnosis Date  . Abnormal Pap smear    leep  . Acne 01/10/2012  . Allergy   . Asthma   . Class 2 obesity due to excess calories without serious comorbidity with body mass index (BMI) of 39.0 to 39.9 in adult 12/24/2013  . Clotting disorder (HCC)   . Thyroid enlargement 01/10/2012   Past Surgical History:  Procedure Laterality Date  . btl  2009  . TONSILLECTOMY    . TUBAL LIGATION     Social History   Socioeconomic History  . Marital status: Married    Spouse name: Not on file  . Number of children: 2  . Years of education: Not on file  . Highest education level: Not on file  Occupational History  . Occupation: Information systems manager: [employer]  Tobacco Use  . Smoking status: Never Smoker  . Smokeless tobacco: Never Used  . Tobacco comment: never used tobacco  Vaping Use  . Vaping Use: Never used  Substance and Sexual Activity  .  Alcohol use: Yes  . Drug use: No  . Sexual activity: Yes    Partners: Male    Birth control/protection: Surgical  Other Topics Concern  . Not on file  Social History Narrative  . Not on file   Social Determinants of Health   Financial Resource Strain:   . Difficulty of Paying Living Expenses:   Food Insecurity:   . Worried About Programme researcher, broadcasting/film/video in the Last Year:   . Barista in the Last Year:   Transportation Needs:   . Freight forwarder (Medical):   [name] Lack of Transportation (Non-Medical):   Physical Activity:   . Days of Exercise per Week:   . Minutes of Exercise per Session:   Stress:   . Feeling of Stress :   Social Connections:   . Frequency of Communication with Friends and Family:   . Frequency of Social Gatherings with Friends and Family:   . Attends Religious Services:   . Active Member of Clubs or Organizations:   . Attends Banker Meetings:   [name] Marital Status:   Intimate Partner Violence:   . Fear of Current or Ex-Partner:   . Emotionally Abused:   [name] Physically Abused:   . Sexually Abused:    Family Status  Relation Name Status  . Sister  Deceased  . Mother  Alive  . Father  Alive  . Brother  Alive  . Son  Alive  . MGM  Deceased  . MGF  Deceased  . PGM  Alive  . PGF  Deceased  . Son  Alive  . Neg Hx  (Not Specified)   Family History  Problem Relation Age of Onset  . Diabetes Sister   . Hypertension Mother   . Depression Mother   . Colon cancer Neg Hx   . Breast cancer Neg Hx    No Known Allergies  Patient Care Team: Erasmo Downer, MD as PCP - General (Family Medicine)   Medications: Outpatient Medications Prior to Visit  Medication Sig  . albuterol (PROVENTIL) (2.5 MG/3ML) 0.083% nebulizer solution Take 3 mLs (2.5 mg total) by nebulization every 6 (six) hours as needed for wheezing or shortness of breath.  . Dupilumab (DUPIXENT) 300 MG/2ML SOSY Inject into the skin.  [name] levothyroxine (SYNTHROID) 25 MCG  tablet TAKE 1 TABLET BY MOUTH DAILY BEFORE BREAKFAST.  [name] triamcinolone cream (KENALOG) 0.1 % Apply 1 application topically 2 (two) times daily.  . VENTOLIN HFA 108 (90 Base) MCG/ACT inhaler TAKE 2 PUFFS BY MOUTH EVERY 6 HOURS AS NEEDED FOR WHEEZE OR SHORTNESS OF BREATH  . Vitamin D, Cholecalciferol, 25 MCG (1000 UT) TABS Take by mouth.  . Naltrexone-buPROPion HCl ER 8-90 MG TB12 Take 1 tab PO daily x7d, then 1 tab PO BID x7d, then 2 tabs qAM and 1 tab qPM x7d, then 2 tabs PO BID x7d (Patient not taking: Reported on 06/06/2020)   No facility-administered medications prior to visit.    Review of Systems  Constitutional: Negative.   HENT: Negative.   Eyes: Negative.   Respiratory: Negative.   Cardiovascular: Negative.   Gastrointestinal: Negative.   Endocrine: Negative.   Genitourinary: Negative.   Musculoskeletal: Negative.   Skin: Negative.   Allergic/Immunologic: Negative.   Neurological: Negative.   Hematological: Negative.   Psychiatric/Behavioral: Negative.       Objective    BP 118/78 (BP Location: Left Arm, Patient Position: Sitting, Cuff Size: Normal)   Pulse 89   Temp (!) 96.9 F (36.1 C) (Temporal)   Ht 5\' 5"  (1.651 m)   Wt 261 lb 3.2 oz (118.5 kg)   LMP 06/06/2020 (Exact Date)   SpO2 98%   BMI 43.47 kg/m    Physical Exam Constitutional:      Appearance: Normal appearance.  HENT:     Right Ear: Tympanic membrane, ear canal and external ear normal.     Left Ear: Tympanic membrane, ear canal and external ear normal.  Cardiovascular:     Rate and Rhythm: Normal rate and regular rhythm.     Pulses: Normal pulses.     Heart sounds: Normal heart sounds.  Pulmonary:     Effort: Pulmonary effort is normal.     Breath sounds: Normal breath sounds.  Abdominal:     General: Abdomen is flat. Bowel sounds are normal.     Palpations: Abdomen is soft.  Skin:    General: Skin is warm and dry.  Neurological:     General: No focal deficit present.     Mental Status:  She is alert and oriented to person, place, and time.  Psychiatric:        Mood and Affect: Mood normal.        Behavior: Behavior normal.       Last depression screening scores PHQ 2/9 Scores 06/06/2020 05/31/2019 02/07/2019  PHQ - 2 Score 0 0 0  PHQ- 9 Score 0 1 2   Last fall risk screening Fall Risk  06/06/2020  Falls in the past year? 0  Number falls in past yr: 0  Injury with Fall? 0   Last Audit-C alcohol use screening Alcohol Use Disorder Test (AUDIT) 06/06/2020  1. How often do you have a drink containing alcohol? 1  2. How many drinks containing alcohol do you have on a typical day when you are drinking? 0  3. How often do you have six or more drinks on one occasion? 0  AUDIT-C Score 1   A score of 3 or more in women, and 4 or more in men indicates increased risk for alcohol abuse, EXCEPT if all of the points are from question 1   No results found for any visits on 06/06/20.  Assessment & Plan    Routine Health Maintenance and Physical Exam  Exercise Activities and Dietary recommendations Goals   None     Immunization History  Administered Date(s) Administered  . Tdap 01/10/2012    Health Maintenance  Topic Date Due  . COVID-19 Vaccine (1) 06/22/2020 (Originally 07/14/1995)  . INFLUENZA VACCINE  06/22/2020  . TETANUS/TDAP  01/09/2022  . PAP SMEAR-Modifier  08/23/2022  . Hepatitis C Screening  Completed  . HIV Screening  Completed    Discussed health benefits of physical activity, and encouraged her to engage in regular exercise appropriate for her age and condition.  1. Annual physical exam  - CBC with Differential/Platelet  2. Morbid obesity (HCC)  Recommend medical weight management clinic. She reports she will think about it.   3. Chronic ITP (idiopathic thrombocytopenia) (HCC)    Return in about 6 months (around 12/07/2020).     ITrey Sailors, PA-C, have reviewed all documentation for this visit. The documentation on 06/10/20 for the  exam, diagnosis, procedures, and orders are all accurate and complete.    Maryella Shivers  [facility] [phone] (phone) [phone] (fax)  [facility]

## 2020-06-06 NOTE — Patient Instructions (Signed)
Preventive Care 21-37 Years Old, Female Preventive care refers to visits with your health care provider and lifestyle choices that can promote health and wellness. This includes:  A yearly physical exam. This may also be called an annual well check.  Regular dental visits and eye exams.  Immunizations.  Screening for certain conditions.  Healthy lifestyle choices, such as eating a healthy diet, getting regular exercise, not using drugs or products that contain nicotine and tobacco, and limiting alcohol use. What can I expect for my preventive care visit? Physical exam Your health care provider will check your:  Height and weight. This may be used to calculate body mass index (BMI), which tells if you are at a healthy weight.  Heart rate and blood pressure.  Skin for abnormal spots. Counseling Your health care provider may ask you questions about your:  Alcohol, tobacco, and drug use.  Emotional well-being.  Home and relationship well-being.  Sexual activity.  Eating habits.  Work and work environment.  Method of birth control.  Menstrual cycle.  Pregnancy history. What immunizations do I need?  Influenza (flu) vaccine  This is recommended every year. Tetanus, diphtheria, and pertussis (Tdap) vaccine  You may need a Td booster every 10 years. Varicella (chickenpox) vaccine  You may need this if you have not been vaccinated. Human papillomavirus (HPV) vaccine  If recommended by your health care provider, you may need three doses over 6 months. Measles, mumps, and rubella (MMR) vaccine  You may need at least one dose of MMR. You may also need a second dose. Meningococcal conjugate (MenACWY) vaccine  One dose is recommended if you are age 19-21 years and a first-year college student living in a residence hall, or if you have one of several medical conditions. You may also need additional booster doses. Pneumococcal conjugate (PCV13) vaccine  You may need  this if you have certain conditions and were not previously vaccinated. Pneumococcal polysaccharide (PPSV23) vaccine  You may need one or two doses if you smoke cigarettes or if you have certain conditions. Hepatitis A vaccine  You may need this if you have certain conditions or if you travel or work in places where you may be exposed to hepatitis A. Hepatitis B vaccine  You may need this if you have certain conditions or if you travel or work in places where you may be exposed to hepatitis B. Haemophilus influenzae type b (Hib) vaccine  You may need this if you have certain conditions. You may receive vaccines as individual doses or as more than one vaccine together in one shot (combination vaccines). Talk with your health care provider about the risks and benefits of combination vaccines. What tests do I need?  Blood tests  Lipid and cholesterol levels. These may be checked every 5 years starting at age 20.  Hepatitis C test.  Hepatitis B test. Screening  Diabetes screening. This is done by checking your blood sugar (glucose) after you have not eaten for a while (fasting).  Sexually transmitted disease (STD) testing.  BRCA-related cancer screening. This may be done if you have a family history of breast, ovarian, tubal, or peritoneal cancers.  Pelvic exam and Pap test. This may be done every 3 years starting at age 21. Starting at age 30, this may be done every 5 years if you have a Pap test in combination with an HPV test. Talk with your health care provider about your test results, treatment options, and if necessary, the need for more tests.   Follow these instructions at home: Eating and drinking   Eat a diet that includes fresh fruits and vegetables, whole grains, lean protein, and low-fat dairy.  Take vitamin and mineral supplements as recommended by your health care provider.  Do not drink alcohol if: ? Your health care provider tells you not to drink. ? You are  pregnant, may be pregnant, or are planning to become pregnant.  If you drink alcohol: ? Limit how much you have to 0-1 drink a day. ? Be aware of how much alcohol is in your drink. In the [place] one drink equals one 12 oz bottle of beer (355 mL), one 5 oz glass of wine (148 mL), or one 1 oz glass of hard liquor (44 mL). Lifestyle  Take daily care of your teeth and gums.  Stay active. Exercise for at least 30 minutes on 5 or more days each week.  Do not use any products that contain nicotine or tobacco, such as cigarettes, e-cigarettes, and chewing tobacco. If you need help quitting, ask your health care provider.  If you are sexually active, practice safe sex. Use a condom or other form of birth control (contraception) in order to prevent pregnancy and STIs (sexually transmitted infections). If you plan to become pregnant, see your health care provider for a preconception visit. What's next?  Visit your health care provider once a year for a well check visit.  Ask your health care provider how often you should have your eyes and teeth checked.  Stay up to date on all vaccines. This information is not intended to replace advice given to you by your health care provider. Make sure you discuss any questions you have with your health care provider. Document Revised: 07/20/2018 Document Reviewed: 07/20/2018 Elsevier Patient Education  2020 [employer].

## 2020-06-09 DIAGNOSIS — Z0000 Encounter for general adult medical examination without abnormal findings: Secondary | ICD-10-CM | POA: Diagnosis not present

## 2020-06-10 LAB — CBC WITH DIFFERENTIAL/PLATELET
Basophils Absolute: 0 10*3/uL (ref 0.0–0.2)
Basos: 1 %
EOS (ABSOLUTE): 0.1 10*3/uL (ref 0.0–0.4)
Eos: 1 %
Hematocrit: 38.6 % (ref 34.0–46.6)
Hemoglobin: 12.7 g/dL (ref 11.1–15.9)
Immature Grans (Abs): 0 10*3/uL (ref 0.0–0.1)
Immature Granulocytes: 0 %
Lymphocytes Absolute: 2.8 10*3/uL (ref 0.7–3.1)
Lymphs: 40 %
MCH: 28.6 pg (ref 26.6–33.0)
MCHC: 32.9 g/dL (ref 31.5–35.7)
MCV: 87 fL (ref 79–97)
Monocytes Absolute: 0.5 10*3/uL (ref 0.1–0.9)
Monocytes: 7 %
Neutrophils Absolute: 3.5 10*3/uL (ref 1.4–7.0)
Neutrophils: 51 %
Platelets: 166 10*3/uL (ref 150–450)
RBC: 4.44 x10E6/uL (ref 3.77–5.28)
RDW: 13.1 % (ref 11.7–15.4)
WBC: 7 10*3/uL (ref 3.4–10.8)

## 2020-08-04 NOTE — Progress Notes (Signed)
Established patient visit   Patient: Chelsey Guzman   DOB: August 16, 1981   39 y.o. Female  MRN: [id number] Visit Date: 08/05/2020  Today's healthcare provider: Trey Sailors, PA-C   Chief Complaint  Patient presents with  . Ear Pain  I,Porsha C McClurkin,acting as a scribe for Trey Sailors, PA-C.,have documented all relevant documentation on the behalf of Trey Sailors, PA-C,as directed by  Trey Sailors, PA-C while in the presence of Trey Sailors, PA-C.  Subjective    Otalgia  There is pain in both ears. This is a new problem. The current episode started 1 to 4 weeks ago. The problem occurs constantly. There has been no fever. The fever has been present for less than 1 day. The pain is at a severity of 3/10. The pain is mild. Associated symptoms include headaches and hearing loss. Pertinent negatives include no coughing, ear discharge, rhinorrhea or sore throat.         Medications: Outpatient Medications Prior to Visit  Medication Sig  . albuterol (PROVENTIL) (2.5 MG/3ML) 0.083% nebulizer solution Take 3 mLs (2.5 mg total) by nebulization every 6 (six) hours as needed for wheezing or shortness of breath.  . Dupilumab (DUPIXENT) 300 MG/2ML SOSY Inject into the skin.  [name] levothyroxine (SYNTHROID) 25 MCG tablet TAKE 1 TABLET BY MOUTH DAILY BEFORE BREAKFAST.  [name] triamcinolone cream (KENALOG) 0.1 % Apply 1 application topically 2 (two) times daily.  . VENTOLIN HFA 108 (90 Base) MCG/ACT inhaler TAKE 2 PUFFS BY MOUTH EVERY 6 HOURS AS NEEDED FOR WHEEZE OR SHORTNESS OF BREATH  . Vitamin D, Cholecalciferol, 25 MCG (1000 UT) TABS Take 5,000 Units by mouth.   . Naltrexone-buPROPion HCl ER 8-90 MG TB12 Take 1 tab PO daily x7d, then 1 tab PO BID x7d, then 2 tabs qAM and 1 tab qPM x7d, then 2 tabs PO BID x7d (Patient not taking: Reported on 08/05/2020)   No facility-administered medications prior to visit.    Review of Systems  HENT: Positive for ear pain and hearing loss.  Negative for ear discharge, rhinorrhea and sore throat.   Respiratory: Negative for cough.   Neurological: Positive for headaches.      Objective    BP 120/85 (BP Location: Left Arm, Patient Position: Sitting, Cuff Size: Large)   Pulse 93   Temp 98.7 F (37.1 C) (Oral)   Wt 261 lb 12.8 oz (118.8 kg)   LMP 08/01/2020 (Exact Date)   SpO2 98%   BMI 43.57 kg/m    Physical Exam Constitutional:      Appearance: Normal appearance.  HENT:     Right Ear: Tympanic membrane and ear canal normal.     Left Ear: Tympanic membrane and ear canal normal.  Skin:    General: Skin is warm and dry.  Neurological:     General: No focal deficit present.     Mental Status: She is alert and oriented to person, place, and time.  Psychiatric:        Mood and Affect: Mood normal.        Behavior: Behavior normal.       No results found for any visits on 08/05/20.  Assessment & Plan    1. Dysfunction of both eustachian tubes  Counseled on 2nd gen antihistamine daily and flonase.     No follow-ups on file.      ITrey Sailors, PA-C, have reviewed all documentation for this visit. The documentation on 08/21/20 for  the exam, diagnosis, procedures, and orders are all accurate and complete.  The entirety of the information documented in the History of Present Illness, Review of Systems and Physical Exam were personally obtained by me. Portions of this information were initially documented by [facility] and reviewed by me for thoroughness and accuracy.     Paulene Floor  [facility] [phone] (phone) [phone] (fax)  [place]

## 2020-08-05 ENCOUNTER — Other Ambulatory Visit: Payer: Self-pay

## 2020-08-05 ENCOUNTER — Ambulatory Visit (INDEPENDENT_AMBULATORY_CARE_PROVIDER_SITE_OTHER): Payer: BLUE CROSS/BLUE SHIELD | Admitting: Physician Assistant

## 2020-08-05 ENCOUNTER — Encounter: Payer: Self-pay | Admitting: Physician Assistant

## 2020-08-05 VITALS — BP 120/85 | HR 93 | Temp 98.7°F | Wt 261.8 lb

## 2020-08-05 DIAGNOSIS — H6983 Other specified disorders of Eustachian tube, bilateral: Secondary | ICD-10-CM | POA: Diagnosis not present

## 2020-08-05 DIAGNOSIS — H6993 Unspecified Eustachian tube disorder, bilateral: Secondary | ICD-10-CM

## 2020-08-05 NOTE — Patient Instructions (Addendum)
Daily allegra/zyrtec/xyzal/claritin AND flonase daily        Allergies, Adult An allergy means that your body reacts to something that bothers it (allergen). It is not a normal reaction. This can happen from something that you:  Eat.  Breathe in.  Touch. You can have an allergy (be allergic) to:  Outdoor things, like: ? Pollen. ? Grass. ? Weeds.  Indoor things, like: ? Dust. ? Smoke. ? Pet dander.  Foods.  Medicines.  Things that bother your skin, like: ? Detergents. ? Chemicals. ? Latex.  Perfume.  Bugs. An allergy cannot spread from person to person (is not contagious). Follow these instructions at home:         Stay away from things that you know you are allergic to.  If you have allergies to things in the air, wash out your nose each day. Do it with one of these: ? A salt-water (saline) spray. ? A container (neti pot).  Take over-the-counter and prescription medicines only as told by your doctor.  Keep all follow-up visits as told by your doctor. This is important.  If you are at risk for a very bad allergy reaction (anaphylaxis), keep an auto-injector with you all the time. This is called an epinephrine injection. ? This is pre-measured medicine with a needle. You can put it into your skin by yourself. ? Right after you have a very bad allergy reaction, you or a person with you must give the medicine in less than a few minutes. This is an emergency.  If you have ever had a very bad allergy reaction, wear a medical alert bracelet or necklace. Your very bad allergy should be written on it. Contact a health care provider if:  Your symptoms do not get better with treatment. Get help right away if:  You have symptoms of a very bad allergy reaction. These include: ? A swollen mouth, tongue, or throat. ? Pain or tightness in your chest. ? Trouble breathing. ? Being short of breath. ? Dizziness. ? Fainting. ? Very bad pain in your belly  (abdomen). ? Throwing up (vomiting). ? Watery poop (diarrhea). Summary  An allergy means that your body reacts to something that bothers it (allergen). It is not a normal reaction.  Stay away from things that make your body react.  Take over-the-counter and prescription medicines only as told by your doctor.  If you are at risk for a very bad allergy reaction, carry an auto-injector (epinephrine injection) all the time. Also, wear a medical alert bracelet or necklace so people know about your allergy. This information is not intended to replace advice given to you by your health care provider. Make sure you discuss any questions you have with your health care provider. Document Revised: 02/27/2019 Document Reviewed: 02/21/2017 Elsevier Patient Education  2020 [employer].

## 2020-08-21 ENCOUNTER — Ambulatory Visit (INDEPENDENT_AMBULATORY_CARE_PROVIDER_SITE_OTHER): Payer: BLUE CROSS/BLUE SHIELD | Admitting: Family Medicine

## 2020-08-21 ENCOUNTER — Encounter: Payer: Self-pay | Admitting: Family Medicine

## 2020-08-21 ENCOUNTER — Other Ambulatory Visit: Payer: Self-pay

## 2020-08-21 VITALS — BP 123/87 | HR 92 | Temp 98.3°F | Wt 263.0 lb

## 2020-08-21 DIAGNOSIS — H65192 Other acute nonsuppurative otitis media, left ear: Secondary | ICD-10-CM

## 2020-08-21 MED ORDER — AMOXICILLIN-POT CLAVULANATE 875-125 MG PO TABS: 1.0000 | ORAL_TABLET | Freq: Two times a day (BID) | ORAL | 0 refills | Status: AC

## 2020-08-21 NOTE — Patient Instructions (Signed)
Otitis Media, Adult  Otitis media means that the middle ear is red and swollen (inflamed) and full of fluid. The condition usually goes away on its own. Follow these instructions at home:  Take over-the-counter and prescription medicines only as told by your doctor.  If you were prescribed an antibiotic medicine, take it as told by your doctor. Do not stop taking the antibiotic even if you start to feel better.  Keep all follow-up visits as told by your doctor. This is important. Contact a doctor if:  You have bleeding from your nose.  There is a lump on your neck.  You are not getting better in 5 days.  You feel worse instead of better. Get help right away if:  You have pain that is not helped with medicine.  You have swelling, redness, or pain around your ear.  You get a stiff neck.  You cannot move part of your face (paralyzed).  You notice that the bone behind your ear hurts when you touch it.  You get a very bad headache. Summary  Otitis media means that the middle ear is red, swollen, and full of fluid.  This condition usually goes away on its own. In some cases, treatment may be needed.  If you were prescribed an antibiotic medicine, take it as told by your doctor. This information is not intended to replace advice given to you by your health care provider. Make sure you discuss any questions you have with your health care provider. Document Revised: 10/21/2017 Document Reviewed: 11/29/2016 Elsevier Patient Education  2020 Else[employer] Inc.  

## 2020-08-21 NOTE — Progress Notes (Signed)
Established patient visit   Patient: Chelsey Guzman   DOB: [birth year]   39 y.o. Female  MRN: [id number] Visit Date: 08/21/2020  Today's healthcare provider: Shirlee Latch, MD   Chief Complaint  Patient presents with  . Ear Pain   Subjective    HPI  3 weeks ago Ms. Cleek woke up with a muffled "under water" sensation in her left ear. She has had occasional sharp pain with this which causes headaches and occasional dizziness. When she has the pain it is up to a 6-7/10. She was seen here 2 weeks ago and encouraged to start zyrtec and flonase, which have not helped. She has also tried peroxide/water and ear drops but nothing has helped.  Medications: Outpatient Medications Prior to Visit  Medication Sig  . albuterol (PROVENTIL) (2.5 MG/3ML) 0.083% nebulizer solution Take 3 mLs (2.5 mg total) by nebulization every 6 (six) hours as needed for wheezing or shortness of breath.  . Dupilumab (DUPIXENT) 300 MG/2ML SOSY Inject into the skin.  [name] levothyroxine (SYNTHROID) 25 MCG tablet TAKE 1 TABLET BY MOUTH DAILY BEFORE BREAKFAST.  [name] triamcinolone cream (KENALOG) 0.1 % Apply 1 application topically 2 (two) times daily.  . VENTOLIN HFA 108 (90 Base) MCG/ACT inhaler TAKE 2 PUFFS BY MOUTH EVERY 6 HOURS AS NEEDED FOR WHEEZE OR SHORTNESS OF BREATH  . Vitamin D, Cholecalciferol, 25 MCG (1000 UT) TABS Take 5,000 Units by mouth.   . Naltrexone-buPROPion HCl ER 8-90 MG TB12 Take 1 tab PO daily x7d, then 1 tab PO BID x7d, then 2 tabs qAM and 1 tab qPM x7d, then 2 tabs PO BID x7d (Patient not taking: Reported on 08/05/2020)   No facility-administered medications prior to visit.    Review of Systems  Constitutional: Negative.   HENT: Positive for ear pain and hearing loss. Negative for congestion, ear discharge, facial swelling, mouth sores, nosebleeds, postnasal drip, rhinorrhea, sinus pressure, sinus pain, sneezing, sore throat, tinnitus, trouble swallowing and voice change.   Respiratory:  Negative.   Gastrointestinal: Negative.   Neurological: Positive for light-headedness and headaches. Negative for dizziness.    Objective    BP 123/87 (BP Location: Right Arm, Patient Position: Sitting, Cuff Size: Large)   Pulse 92   Temp 98.3 F (36.8 C) (Oral)   Wt 263 lb (119.3 kg)   LMP 08/01/2020 (Exact Date)   BMI 43.77 kg/m    Physical Exam Vitals reviewed.  Constitutional:      General: She is not in acute distress.    Appearance: She is obese. She is not ill-appearing, toxic-appearing or diaphoretic.  HENT:     Head: Normocephalic.     Right Ear: Hearing, tympanic membrane, ear canal and external ear normal. No middle ear effusion. There is no impacted cerumen. Tympanic membrane is not erythematous.     Left Ear: Ear canal and external ear normal. Decreased hearing noted. A middle ear effusion is present. There is no impacted cerumen. Tympanic membrane is erythematous.     Ears:     Weber exam findings: lateralizes right. Neurological:     Mental Status: She is alert. Mental status is at baseline.     Comments: Decreased hearing in right ear; CN 5, 7, 11, and 12 tested and intact.     No results found for any visits on 08/21/20.  Assessment & Plan     1. Acute MEE (middle ear effusion), left - Continue flonase - Continue zyrtec - Start augmentin x7d - If no improvement,  refer to ENT  Meds ordered this encounter  Medications  . amoxicillin-clavulanate (AUGMENTIN) 875-125 MG tablet    Sig: Take 1 tablet by mouth 2 (two) times daily for 7 days.    Dispense:  14 tablet    Refill:  0     Return if symptoms worsen or fail to improve.      Higinio Roger, MS3    Patient seen along with MS3 student Higinio Roger. I personally evaluated this patient along with the student, and verified all aspects of the history, physical exam, and medical decision making as documented by the student. I agree with the student's documentation and have made all necessary  edits.  Randall Colden, Marzella Schlein, MD, MPH [facility]

## 2020-08-28 ENCOUNTER — Encounter: Payer: Self-pay | Admitting: Family Medicine

## 2020-09-03 NOTE — Telephone Encounter (Signed)
This last message never got to me.  Maybe someone could see her and look in her ear today?  She probably also needs ENT referral.

## 2020-09-04 ENCOUNTER — Ambulatory Visit: Payer: BLUE CROSS/BLUE SHIELD | Admitting: Family Medicine

## 2020-09-04 ENCOUNTER — Telehealth: Payer: Self-pay

## 2020-09-04 DIAGNOSIS — H9202 Otalgia, left ear: Secondary | ICD-10-CM

## 2020-09-04 NOTE — Telephone Encounter (Signed)
Ok to place ent referral

## 2020-09-04 NOTE — Telephone Encounter (Signed)
Referral placed.

## 2020-09-10 DIAGNOSIS — J301 Allergic rhinitis due to pollen: Secondary | ICD-10-CM | POA: Diagnosis not present

## 2020-09-10 DIAGNOSIS — E041 Nontoxic single thyroid nodule: Secondary | ICD-10-CM | POA: Diagnosis not present

## 2020-09-10 DIAGNOSIS — H9202 Otalgia, left ear: Secondary | ICD-10-CM | POA: Diagnosis not present

## 2020-09-10 DIAGNOSIS — H6982 Other specified disorders of Eustachian tube, left ear: Secondary | ICD-10-CM | POA: Diagnosis not present

## 2020-12-08 ENCOUNTER — Telehealth: Payer: Self-pay | Admitting: Family Medicine

## 2020-12-11 ENCOUNTER — Encounter: Payer: Self-pay | Admitting: Family Medicine

## 2020-12-11 ENCOUNTER — Telehealth (INDEPENDENT_AMBULATORY_CARE_PROVIDER_SITE_OTHER): Payer: BLUE CROSS/BLUE SHIELD | Admitting: Family Medicine

## 2020-12-11 DIAGNOSIS — E785 Hyperlipidemia, unspecified: Secondary | ICD-10-CM | POA: Diagnosis not present

## 2020-12-11 DIAGNOSIS — E039 Hypothyroidism, unspecified: Secondary | ICD-10-CM | POA: Diagnosis not present

## 2020-12-11 DIAGNOSIS — D693 Immune thrombocytopenic purpura: Secondary | ICD-10-CM | POA: Diagnosis not present

## 2020-12-11 DIAGNOSIS — K5909 Other constipation: Secondary | ICD-10-CM

## 2020-12-11 MED ORDER — LINACLOTIDE 72 MCG PO CAPS: 72.0000 ug | ORAL_CAPSULE | Freq: Every day | ORAL | 2 refills | Status: DC

## 2020-12-11 NOTE — Assessment & Plan Note (Signed)
Previously well controlled Chronic constipation - see plan below Continue Synthroid at current dose  Recheck TSH and adjust Synthroid as indicated

## 2020-12-11 NOTE — Patient Instructions (Signed)
Constipation, Adult Constipation is when a person has trouble pooping (having a bowel movement). When you have this condition, you may poop fewer than 3 times a week. Your poop (stool) may also be dry, hard, or bigger than normal. Follow these instructions at home: Eating and drinking  Eat foods that have a lot of fiber, such as: ? Fresh fruits and vegetables. ? Whole grains. ? Beans.  Eat less of foods that are low in fiber and high in fat and sugar, such as: ? French fries. ? Hamburgers. ? Cookies. ? Candy. ? Soda.  Drink enough fluid to keep your pee (urine) pale yellow.   General instructions  Exercise regularly or as told by your doctor. Try to do 150 minutes of exercise each week.  Go to the restroom when you feel like you need to poop. Do not hold it in.  Take over-the-counter and prescription medicines only as told by your doctor. These include any fiber supplements.  When you poop: ? Do deep breathing while relaxing your lower belly (abdomen). ? Relax your pelvic floor. The pelvic floor is a group of muscles that support the rectum, bladder, and intestines (as well as the uterus in women).  Watch your condition for any changes. Tell your doctor if you notice any.  Keep all follow-up visits as told by your doctor. This is important. Contact a doctor if:  You have pain that gets worse.  You have a fever.  You have not pooped for 4 days.  You vomit.  You are not hungry.  You lose weight.  You are bleeding from the opening of the butt (anus).  You have thin, pencil-like poop. Get help right away if:  You have a fever, and your symptoms suddenly get worse.  You leak poop or have blood in your poop.  Your belly feels hard or bigger than normal (bloated).  You have very bad belly pain.  You feel dizzy or you faint. Summary  Constipation is when a person poops fewer than 3 times a week, has trouble pooping, or has poop that is dry, hard, or bigger than  normal.  Eat foods that have a lot of fiber.  Drink enough fluid to keep your pee (urine) pale yellow.  Take over-the-counter and prescription medicines only as told by your doctor. These include any fiber supplements. This information is not intended to replace advice given to you by your health care provider. Make sure you discuss any questions you have with your health care provider. Document Revised: 09/26/2019 Document Reviewed: 09/26/2019 Elsevier Patient Education  2021 [employer] Inc.  

## 2020-12-11 NOTE — Assessment & Plan Note (Signed)
Discussed importance of healthy weight management Discussed diet and exercise  Patient wants to consider phentermine - will discuss further at next visit - not starting currently as trying to treat constipation and this could worsen it

## 2020-12-11 NOTE — Assessment & Plan Note (Signed)
Longstanding issue, newly discussed No signs or symptoms or fam hx IBD Discussed water intake and fiber Tried OTC Miralax and stool softeners without relief Trial of Linzess at low dose F/u in 3 months

## 2020-12-11 NOTE — Assessment & Plan Note (Signed)
Reviewed last lipid panel Not currently on a statin Recheck FLP and CMP Discussed diet and exercise  

## 2020-12-11 NOTE — Progress Notes (Signed)
MyChart Video Visit    Virtual Visit via Video Note   This visit type was conducted due to national recommendations for restrictions regarding the COVID-19 Pandemic (e.g. social distancing) in an effort to limit this patient's exposure and mitigate transmission in our community. This patient is at least at moderate risk for complications without adequate follow up. This format is felt to be most appropriate for this patient at this time. Physical exam was limited by quality of the video and audio technology used for the visit.    Patient location: home Provider location: home office  Persons involved in the visit: patient, provider  I discussed the limitations of evaluation and management by telemedicine and the availability of in person appointments. The patient expressed understanding and agreed to proceed.  Patient: Chelsey Guzman   DOB: 1980-12-16   39 y.o. Female  MRN: [id number] Visit Date: 12/11/2020  Today's healthcare provider: Shirlee Latch, MD   Chief Complaint  Patient presents with  . Hypothyroidism   Subjective    Thyroid Problem Presents for follow-up visit. Symptoms include constipation. Patient reports no cold intolerance, depressed mood, diarrhea, fatigue, hair loss, heat intolerance, hoarse voice, weight gain or weight loss. The symptoms have been stable.  Constipation This is a chronic problem. The problem has been gradually worsening since onset. The patient is on a high fiber diet. There has been adequate water intake. Associated symptoms include abdominal pain (Occasionally). Pertinent negatives include no diarrhea, nausea, rectal pain, vomiting or weight loss.   Constipation is a longstanding issue.  Mother and son also suffer from in. Abd cramping when has gone a while between BMs. Can go 2-7 days between BMs.  No fam hx of IBD.  Has tried stool softeners and Miralax.  Skin is also drier than usually within the last few weeks.   Patient Active  Problem List   Diagnosis Date Noted  . Chronic constipation 12/11/2020  . RLQ abdominal pain 12/03/2019  . Costochondritis 02/08/2019  . Acquired hypothyroidism 03/02/2018  . Eczema 07/20/2016  . History of non anemic vitamin B12 deficiency 08/19/2015  . Vitamin D deficiency 08/05/2015  . 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 Chronic ITP (idiopathic thrombocytopenia) (HCC) 01/27/2015  . Hyperlipidemia 01/22/2015  . Morbid obesity (HCC) 12/24/2013  . Abnormal Pap smear 01/10/2012  . Thyroid enlargement 01/10/2012  . Acne 01/10/2012   Past Medical History:  Diagnosis Date  . Abnormal Pap smear    leep  . Acne 01/10/2012  . Allergy   . Asthma   . Class 2 obesity due to excess calories without serious comorbidity with body mass index (BMI) of 39.0 to 39.9 in adult 12/24/2013  . Clotting disorder (HCC)   . Thyroid enlargement 01/10/2012   Social History   Tobacco Use  . Smoking status: Never Smoker  . Smokeless tobacco: Never Used  . Tobacco comment: never used tobacco  Vaping Use  . Vaping Use: Never used  Substance Use Topics  . Alcohol use: Yes  . Drug use: No   No Known Allergies  Medications: Outpatient Medications Prior to Visit  Medication Sig  . albuterol (PROVENTIL) (2.5 MG/3ML) 0.083% nebulizer solution Take 3 mLs (2.5 mg total) by nebulization every 6 (six) hours as needed for wheezing or shortness of breath.  . dupilumab (DUPIXENT) 300 MG/2ML prefilled syringe Inject into the skin.  [name] levothyroxine (SYNTHROID) 25 MCG tablet TAKE 1 TABLET BY MOUTH DAILY BEFORE BREAKFAST.  [name] triamcinolone cream (KENALOG) 0.1 % Apply  1 application topically 2 (two) times daily.  . VENTOLIN HFA 108 (90 Base) MCG/ACT inhaler TAKE 2 PUFFS BY MOUTH EVERY 6 HOURS AS NEEDED FOR WHEEZE OR SHORTNESS OF BREATH  . Vitamin D, Cholecalciferol, 25 MCG (1000 UT) TABS Take 5,000 Units by mouth.    No facility-administered medications prior to visit.    Review of Systems   Constitutional: Negative.  Negative for fatigue, weight gain and weight loss.  HENT: Negative for hoarse voice, sore throat and trouble swallowing.   Respiratory: Negative.   Cardiovascular: Negative.   Gastrointestinal: Positive for abdominal pain (Occasionally) and constipation. Negative for abdominal distention, anal bleeding, blood in stool, diarrhea, nausea, rectal pain and vomiting.  Endocrine: Negative.  Negative for cold intolerance and heat intolerance.  Neurological: Negative for dizziness, light-headedness and headaches.      Objective    There were no vitals taken for this visit.   Physical Exam Constitutional:      General: She is not in acute distress.    Appearance: Normal appearance.  HENT:     Head: Normocephalic.  Pulmonary:     Effort: Pulmonary effort is normal. No respiratory distress.  Neurological:     Mental Status: She is alert. Mental status is at baseline.  Psychiatric:        Mood and Affect: Mood normal.        Assessment & Plan     Problem List Items Addressed This Visit      Digestive   Chronic constipation    Longstanding issue, newly discussed No signs or symptoms or fam hx IBD Discussed water intake and fiber Tried OTC Miralax and stool softeners without relief Trial of Linzess at low dose F/u in 3 months        Endocrine   Acquired hypothyroidism - Primary    Previously well controlled Chronic constipation - see plan below Continue Synthroid at current dose  Recheck TSH and adjust Synthroid as indicated       Relevant Orders   TSH     Musculoskeletal and Integument   Chronic ITP (idiopathic thrombocytopenia) (HCC)    Chronic and stable Recheck CBC Hematology referral if platelets <100      Relevant Orders   CBC     Other   Morbid obesity (HCC)    Discussed importance of healthy weight management Discussed diet and exercise  Patient wants to consider phentermine - will discuss further at next visit - not  starting currently as trying to treat constipation and this could worsen it      Hyperlipidemia    Reviewed last lipid panel Not currently on a statin Recheck FLP and CMP Discussed diet and exercise       Relevant Orders   Comprehensive metabolic panel   Lipid panel       Return in about 3 months (around 03/11/2021) for chronic disease f/u.     I discussed the assessment and treatment plan with the patient. The patient was provided an opportunity to ask questions and all were answered. The patient agreed with the plan and demonstrated an understanding of the instructions.   The patient was advised to call back or seek an in-person evaluation if the symptoms worsen or if the condition fails to improve as anticipated.  I, Shirlee Latch, MD, have reviewed all documentation for this visit. The documentation on 12/11/20 for the exam, diagnosis, procedures, and orders are all accurate and complete.   Chelsey Guzman, Chelsey Schlein, MD, MPH [facility]

## 2020-12-11 NOTE — Assessment & Plan Note (Signed)
Chronic and stable Recheck CBC Hematology referral if platelets <100

## 2020-12-16 DIAGNOSIS — D693 Immune thrombocytopenic purpura: Secondary | ICD-10-CM | POA: Diagnosis not present

## 2020-12-16 DIAGNOSIS — E785 Hyperlipidemia, unspecified: Secondary | ICD-10-CM | POA: Diagnosis not present

## 2020-12-16 DIAGNOSIS — E039 Hypothyroidism, unspecified: Secondary | ICD-10-CM | POA: Diagnosis not present

## 2020-12-17 LAB — CBC
Hematocrit: 40.1 % (ref 34.0–46.6)
Hemoglobin: 13.3 g/dL (ref 11.1–15.9)
MCH: 28.9 pg (ref 26.6–33.0)
MCHC: 33.2 g/dL (ref 31.5–35.7)
MCV: 87 fL (ref 79–97)
Platelets: 164 10*3/uL (ref 150–450)
RBC: 4.61 x10E6/uL (ref 3.77–5.28)
RDW: 12.8 % (ref 11.7–15.4)
WBC: 6.4 10*3/uL (ref 3.4–10.8)

## 2020-12-17 LAB — COMPREHENSIVE METABOLIC PANEL
ALT: 13 IU/L (ref 0–32)
AST: 19 IU/L (ref 0–40)
Albumin/Globulin Ratio: 1.4 (ref 1.2–2.2)
Albumin: 4.1 g/dL (ref 3.8–4.8)
Alkaline Phosphatase: 78 IU/L (ref 44–121)
BUN/Creatinine Ratio: 10 (ref 9–23)
BUN: 9 mg/dL (ref 6–20)
Bilirubin Total: 0.4 mg/dL (ref 0.0–1.2)
CO2: 24 mmol/L (ref 20–29)
Calcium: 9.3 mg/dL (ref 8.7–10.2)
Chloride: 103 mmol/L (ref 96–106)
Creatinine, Ser: 0.92 mg/dL (ref 0.57–1.00)
GFR calc Af Amer: 92 mL/min/{1.73_m2} (ref >59)
GFR calc non Af Amer: 80 mL/min/{1.73_m2} (ref >59)
Globulin, Total: 3 g/dL (ref 1.5–4.5)
Glucose: 105 mg/dL — ABNORMAL HIGH (ref 65–99)
Potassium: 4.6 mmol/L (ref 3.5–5.2)
Sodium: 138 mmol/L (ref 134–144)
Total Protein: 7.1 g/dL (ref 6.0–8.5)

## 2020-12-17 LAB — LIPID PANEL
Chol/HDL Ratio: 5 ratio — ABNORMAL HIGH (ref 0.0–4.4)
Cholesterol, Total: 236 mg/dL — ABNORMAL HIGH (ref 100–199)
HDL: 47 mg/dL (ref >39)
LDL Chol Calc (NIH): 170 mg/dL — ABNORMAL HIGH (ref 0–99)
Triglycerides: 105 mg/dL (ref 0–149)
VLDL Cholesterol Cal: 19 mg/dL (ref 5–40)

## 2020-12-17 LAB — TSH: TSH: 5.32 u[IU]/mL — ABNORMAL HIGH (ref 0.450–4.500)

## 2020-12-18 ENCOUNTER — Telehealth: Payer: Self-pay

## 2020-12-18 DIAGNOSIS — E039 Hypothyroidism, unspecified: Secondary | ICD-10-CM

## 2020-12-18 MED ORDER — LEVOTHYROXINE SODIUM 50 MCG PO TABS: 50.0000 ug | ORAL_TABLET | Freq: Every day | ORAL | 1 refills | Status: DC

## 2020-12-18 NOTE — Telephone Encounter (Signed)
Patient advised as below. Patient verbalizes understanding and is in agreement with treatment plan.  

## 2020-12-18 NOTE — Telephone Encounter (Signed)
-----   Message from Erasmo Downer, MD sent at 12/18/2020 10:45 AM EST ----- Normal labs, except for elevated cholesterol and thyroid hormone.  If taking Synthroid regularly, would recommend increasing the dose to daily (CMA ok to send in Rx if patient agrees).  For cholesterol, I recommend diet low in saturated fat and regular exercise - 30 min at least 5 times per week.

## 2020-12-19 ENCOUNTER — Other Ambulatory Visit: Payer: Self-pay | Admitting: Family Medicine

## 2021-01-06 ENCOUNTER — Other Ambulatory Visit: Payer: Self-pay | Admitting: "Endocrinology

## 2021-01-06 DIAGNOSIS — E049 Nontoxic goiter, unspecified: Secondary | ICD-10-CM | POA: Diagnosis not present

## 2021-01-06 DIAGNOSIS — E039 Hypothyroidism, unspecified: Secondary | ICD-10-CM | POA: Diagnosis not present

## 2021-01-20 ENCOUNTER — Other Ambulatory Visit: Payer: Self-pay

## 2021-01-20 ENCOUNTER — Ambulatory Visit
Admission: RE | Admit: 2021-01-20 | Discharge: 2021-01-20 | Disposition: A | Discharge status: Home / Self Care | Payer: BLUE CROSS/BLUE SHIELD | Source: Ambulatory Visit | Attending: "Endocrinology | Admitting: "Endocrinology

## 2021-01-20 DIAGNOSIS — E049 Nontoxic goiter, unspecified: Secondary | ICD-10-CM | POA: Diagnosis not present

## 2021-05-19 DIAGNOSIS — E039 Hypothyroidism, unspecified: Secondary | ICD-10-CM | POA: Diagnosis not present

## 2021-05-19 DIAGNOSIS — E049 Nontoxic goiter, unspecified: Secondary | ICD-10-CM | POA: Diagnosis not present

## 2021-06-05 ENCOUNTER — Ambulatory Visit (INDEPENDENT_AMBULATORY_CARE_PROVIDER_SITE_OTHER): Payer: BLUE CROSS/BLUE SHIELD | Admitting: Family Medicine

## 2021-06-05 ENCOUNTER — Other Ambulatory Visit: Payer: Self-pay

## 2021-06-05 ENCOUNTER — Encounter: Payer: Self-pay | Admitting: Family Medicine

## 2021-06-05 VITALS — BP 121/82 | HR 86 | Temp 98.8°F | Resp 16 | Ht 65.0 in | Wt 262.9 lb

## 2021-06-05 DIAGNOSIS — E039 Hypothyroidism, unspecified: Secondary | ICD-10-CM | POA: Diagnosis not present

## 2021-06-05 DIAGNOSIS — Z0000 Encounter for general adult medical examination without abnormal findings: Secondary | ICD-10-CM | POA: Diagnosis not present

## 2021-06-05 DIAGNOSIS — D693 Immune thrombocytopenic purpura: Secondary | ICD-10-CM | POA: Diagnosis not present

## 2021-06-05 DIAGNOSIS — E559 Vitamin D deficiency, unspecified: Secondary | ICD-10-CM

## 2021-06-05 DIAGNOSIS — Z6841 Body Mass Index (BMI) 40.0 and over, adult: Secondary | ICD-10-CM

## 2021-06-05 DIAGNOSIS — Z8639 Personal history of other endocrine, nutritional and metabolic disease: Secondary | ICD-10-CM

## 2021-06-05 DIAGNOSIS — E785 Hyperlipidemia, unspecified: Secondary | ICD-10-CM | POA: Diagnosis not present

## 2021-06-05 NOTE — Assessment & Plan Note (Signed)
Recheck B12 

## 2021-06-05 NOTE — Assessment & Plan Note (Signed)
Chronic and stable Recheck CBC Hematology referral if platelets <100

## 2021-06-05 NOTE — Assessment & Plan Note (Signed)
Reviewed last lipid panel Not currently on a statin Recheck FLP and CMP Discussed diet and exercise  

## 2021-06-05 NOTE — Progress Notes (Signed)
Complete physical exam   Patient: Chelsey Guzman   DOB: 06/28/1981   40 y.o. Female  MRN: [id number] Visit Date: 06/05/2021  Today's healthcare provider: Shirlee Latch, MD   Chief Complaint  Patient presents with   Annual Exam   Subjective    Chelsey Guzman is a 40 y.o. female who presents today for a complete physical exam.  She reports consuming a general diet. Gym/ health club routine includes cardio. She generally feels well. She reports sleeping poorly. She does not have additional problems to discuss today.   HPI  Thyroid enlargement  Dr. Gershon Crane is monitoring her thyroid activity and treating her obesity.   Weight Loss  She is tolerating phentermine however she had been recommended to switch wegovy by her endocrinology, Dr. Gershon Crane for weight loss.   Vaccine She has received 2 COVID vaccine and is eligible for the booster. Doesn't have any additional questions.   Screenings  08/23/17 Pap/HPV-negative  Past Medical History:  Diagnosis Date   Abnormal Pap smear    leep   Acne 01/10/2012   Allergy    Asthma    Class 2 obesity due to excess calories without serious comorbidity with body mass index (BMI) of 39.0 to 39.9 in adult 12/24/2013   Clotting disorder (HCC)    Thyroid enlargement 01/10/2012   Past Surgical History:  Procedure Laterality Date   btl  2009   TONSILLECTOMY     TUBAL LIGATION     Social History   Socioeconomic History   Marital status: Married    Spouse name: Not on file   Number of children: 2   Years of education: Not on file   Highest education level: Not on file  Occupational History   Occupation: financial recovery    Employer: [employer]  Tobacco Use   Smoking status: Never   Smokeless tobacco: Never   Tobacco comments:    never used tobacco  Vaping Use   Vaping Use: Never used  Substance and Sexual Activity   Alcohol use: Yes   Drug use: No   Sexual activity: Yes    Partners: Male    Birth  control/protection: Surgical  Other Topics Concern   Not on file  Social History Narrative   Not on file   Social Determinants of Health   Financial Resource Strain: Not on file  Food Insecurity: Not on file  Transportation Needs: Not on file  Physical Activity: Not on file  Stress: Not on file  Social Connections: Not on file  Intimate Partner Violence: Not on file   Family Status  Relation Name Status   Sister  Deceased   Mother  Alive   Father  Alive   Brother  Alive   Son  Alive   MGM  Deceased   MGF  Deceased   PGM  Alive   PGF  Deceased   Son  Alive   Neg Hx  (Not Specified)   Family History  Problem Relation Age of Onset   Diabetes Sister    Hypertension Mother    Depression Mother    Colon cancer Neg Hx    Breast cancer Neg Hx    No Known Allergies  Patient Care Team: Erasmo Downer, MD as PCP - General (Family Medicine)   Medications: Outpatient Medications Prior to Visit  Medication Sig   albuterol (PROVENTIL) (2.5 MG/3ML) 0.083% nebulizer solution Take 3 mLs (2.5 mg total) by nebulization every 6 (six) hours as needed  for wheezing or shortness of breath.   dupilumab (DUPIXENT) 300 MG/2ML prefilled syringe Inject into the skin.   levothyroxine (SYNTHROID) 50 MCG tablet Take 1 tablet (50 mcg total) by mouth daily before breakfast.   triamcinolone cream (KENALOG) 0.1 % Apply 1 application topically 2 (two) times daily.   VENTOLIN HFA 108 (90 Base) MCG/ACT inhaler TAKE 2 PUFFS BY MOUTH EVERY 6 HOURS AS NEEDED FOR WHEEZE OR SHORTNESS OF BREATH   Vitamin D, Cholecalciferol, 25 MCG (1000 UT) TABS Take 5,000 Units by mouth.    [DISCONTINUED] linaclotide (LINZESS) 72 MCG capsule Take 1 capsule (72 mcg total) by mouth daily before breakfast.   No facility-administered medications prior to visit.    Review of Systems  Constitutional:  Negative for chills, fatigue and fever.  HENT:  Negative for ear pain, sinus pressure, sinus pain and sore throat.    Eyes:  Negative for pain and visual disturbance.  Respiratory:  Negative for cough, chest tightness, shortness of breath and wheezing.   Cardiovascular:  Negative for chest pain, palpitations and leg swelling.  Gastrointestinal:  Negative for abdominal pain, blood in stool, constipation, diarrhea, nausea and vomiting.  Genitourinary:  Negative for dysuria, flank pain, frequency, pelvic pain and urgency.  Musculoskeletal:  Negative for back pain, myalgias and neck pain.  Neurological:  Negative for dizziness, seizures, syncope, weakness, light-headedness, numbness and headaches.  Hematological:  Bruises/bleeds easily.  All other systems reviewed and are negative.  Last CBC Lab Results  Component Value Date   WBC 6.4 12/16/2020   HGB 13.3 12/16/2020   HCT 40.1 12/16/2020   MCV 87 12/16/2020   MCH 28.9 12/16/2020   RDW 12.8 12/16/2020   PLT 164 12/16/2020   Last metabolic panel Lab Results  Component Value Date   GLUCOSE 105 (H) 12/16/2020   NA 138 12/16/2020   K 4.6 12/16/2020   CL 103 12/16/2020   CO2 24 12/16/2020   BUN 9 12/16/2020   CREATININE 0.92 12/16/2020   GFRNONAA 80 12/16/2020   GFRAA 92 12/16/2020   CALCIUM 9.3 12/16/2020   PROT 7.1 12/16/2020   ALBUMIN 4.1 12/16/2020   LABGLOB 3.0 12/16/2020   AGRATIO 1.4 12/16/2020   BILITOT 0.4 12/16/2020   ALKPHOS 78 12/16/2020   AST 19 12/16/2020   ALT 13 12/16/2020   Last lipids Lab Results  Component Value Date   CHOL 236 (H) 12/16/2020   HDL 47 12/16/2020   LDLCALC 170 (H) 12/16/2020   TRIG 105 12/16/2020   CHOLHDL 5.0 (H) 12/16/2020   Last hemoglobin A1c Lab Results  Component Value Date   HGBA1C 5.5 12/04/2019   Last thyroid functions Lab Results  Component Value Date   TSH 5.320 (H) 12/16/2020   Last vitamin D Lab Results  Component Value Date   25OHVITD2 48 10/13/2015   25OHVITD3 7 10/13/2015   VD25OH 44.3 12/04/2019   Last vitamin B12 and Folate Lab Results  Component Value Date    VITAMINB12 516 02/08/2019   FOLATE 13.3 08/04/2015      Objective    There were no vitals taken for this visit. BP Readings from Last 3 Encounters:  08/21/20 123/87  08/05/20 120/85  06/06/20 118/78   Wt Readings from Last 3 Encounters: 08/21/20 263 lb (119.3 kg) 08/05/20 261 lb 12.8 oz (118.8 kg) 06/06/20 261 lb 3.2 oz (118.5 kg)      Physical Exam Vitals reviewed.  Constitutional:      General: She is not in acute distress.  Appearance: Normal appearance. She is well-developed. She is not diaphoretic.  HENT:     Head: Normocephalic and atraumatic.     Right Ear: Tympanic membrane, ear canal and external ear normal.     Left Ear: Tympanic membrane, ear canal and external ear normal.     Nose: Nose normal.     Mouth/Throat:     Mouth: Mucous membranes are moist.     Pharynx: Oropharynx is clear. No oropharyngeal exudate.  Eyes:     General: No scleral icterus.    Conjunctiva/sclera: Conjunctivae normal.     Pupils: Pupils are equal, round, and reactive to light.  Neck:     Thyroid: No thyromegaly.  Cardiovascular:     Rate and Rhythm: Normal rate and regular rhythm.     Pulses: Normal pulses.     Heart sounds: Normal heart sounds. No murmur heard. Pulmonary:     Effort: Pulmonary effort is normal. No respiratory distress.     Breath sounds: Normal breath sounds. No wheezing or rales.  Abdominal:     General: There is no distension.     Palpations: Abdomen is soft.     Tenderness: There is no abdominal tenderness.  Musculoskeletal:        General: No deformity.     Cervical back: Neck supple.     Right lower leg: No edema.     Left lower leg: No edema.  Lymphadenopathy:     Cervical: No cervical adenopathy.  Skin:    General: Skin is warm and dry.     Findings: No rash.  Neurological:     Mental Status: She is alert and oriented to person, place, and time. Mental status is at baseline.     Sensory: No sensory deficit.     Motor: No weakness.      Gait: Gait normal.  Psychiatric:        Mood and Affect: Mood normal.        Behavior: Behavior normal.        Thought Content: Thought content normal.   Last depression screening scores PHQ 2/9 Scores 08/21/2020 06/06/2020 05/31/2019  PHQ - 2 Score 0 0 0  PHQ- 9 Score 2 0 1   Last fall risk screening Fall Risk  08/21/2020  Falls in the past year? 0  Number falls in past yr: 0  Injury with Fall? 0  Risk for fall due to : No Fall Risks  Follow up Falls evaluation completed   Last Audit-C alcohol use screening Alcohol Use Disorder Test (AUDIT) 08/21/2020  1. How often do you have a drink containing alcohol? 1  2. How many drinks containing alcohol do you have on a typical day when you are drinking? 0  3. How often do you have six or more drinks on one occasion? 0  AUDIT-C Score 1  Alcohol Brief Interventions/Follow-up AUDIT Score <7 follow-up not indicated   A score of 3 or more in women, and 4 or more in men indicates increased risk for alcohol abuse, EXCEPT if all of the points are from question 1   No results found for any visits on 06/05/21.  Assessment & Plan     Problem List Items Addressed This Visit       Endocrine   Acquired hypothyroidism    Now followed by endocrinology Reviewed last TSH       Relevant Medications   levothyroxine (SYNTHROID) 88 MCG tablet     Musculoskeletal and Integument  Chronic ITP (idiopathic thrombocytopenia) (HCC)    Chronic and stable Recheck CBC Hematology referral if platelets <100       Relevant Orders   CBC w/Diff/Platelet     Other   Morbid obesity (HCC)    Discussed importance of healthy weight management Discussed diet and exercise  Taking phentermine currently per Endo Discussed beenfits of Wegovy       Relevant Medications   phentermine (ADIPEX-P) 37.5 MG tablet   Other Relevant Orders   Lipid panel   Comprehensive metabolic panel   CBC w/Diff/Platelet   Hyperlipidemia    Reviewed last lipid panel Not  currently on a statin Recheck FLP and CMP Discussed diet and exercise        Relevant Orders   Lipid panel   Comprehensive metabolic panel   Vitamin D deficiency   Relevant Orders   VITAMIN D 25 Hydroxy (Vit-D Deficiency, Fractures)   History of non anemic vitamin B12 deficiency    Recheck B12       Relevant Orders   B12   Other Visit Diagnoses     Annual physical exam    -  Primary   Relevant Orders   Lipid panel   Comprehensive metabolic panel   CBC w/Diff/Platelet   B12   VITAMIN D 25 Hydroxy (Vit-D Deficiency, Fractures)   BMI 40.0-44.9, adult (HCC)       Relevant Medications   phentermine (ADIPEX-P) 37.5 MG tablet        Routine Health Maintenance and Physical Exam  Exercise Activities and Dietary recommendations  Goals   None     Immunization History  Administered Date(s) Administered   Tdap 01/10/2012    Health Maintenance  Topic Date Due   COVID-19 Vaccine (1) Never done   INFLUENZA VACCINE  06/22/2021   TETANUS/TDAP  01/09/2022   PAP SMEAR-Modifier  08/23/2022   Hepatitis C Screening  Completed   HIV Screening  Completed   Pneumococcal Vaccine 390-38 Years old  Aged Out   HPV VACCINES  Aged Out    Discussed health benefits of physical activity, and encouraged her to engage in regular exercise appropriate for her age and condition.   Return in about 1 year (around 06/05/2022) for CPE.      I,Essence Turner,acting as a Neurosurgeonscribe for Shirlee LatchAngela Taila Basinski, MD.,have documented all relevant documentation on the behalf of Shirlee LatchAngela Josel Keo, MD,as directed by  Shirlee LatchAngela Kelvyn Schunk, MD while in the presence of Shirlee LatchAngela Octavis Sheeler, MD.  I, Shirlee LatchAngela Janice Bodine, MD, have reviewed all documentation for this visit. The documentation on 06/05/21 for the exam, diagnosis, procedures, and orders are all accurate and complete.   Norberta Stobaugh, Marzella SchleinAngela M, MD, MPH [facility]

## 2021-06-05 NOTE — Assessment & Plan Note (Signed)
Now followed by endocrinology Reviewed last TSH

## 2021-06-05 NOTE — Patient Instructions (Signed)
Preventive Care 21-39 Years Old, Female Preventive care refers to lifestyle choices and visits with your health care provider that can promote health and wellness. This includes: A yearly physical exam. This is also called an annual wellness visit. Regular dental and eye exams. Immunizations. Screening for certain conditions. Healthy lifestyle choices, such as: Eating a healthy diet. Getting regular exercise. Not using drugs or products that contain nicotine and tobacco. Limiting alcohol use. What can I expect for my preventive care visit? Physical exam Your health care provider may check your: Height and weight. These may be used to calculate your BMI (body mass index). BMI is a measurement that tells if you are at a healthy weight. Heart rate and blood pressure. Body temperature. Skin for abnormal spots. Counseling Your health care provider may ask you questions about your: Past medical problems. Family's medical history. Alcohol, tobacco, and drug use. Emotional well-being. Home life and relationship well-being. Sexual activity. Diet, exercise, and sleep habits. Work and work environment. Access to firearms. Method of birth control. Menstrual cycle. Pregnancy history. What immunizations do I need?  Vaccines are usually given at various ages, according to a schedule. Your health care provider will recommend vaccines for you based on your age, medicalhistory, and lifestyle or other factors, such as travel or where you work. What tests do I need?  Blood tests Lipid and cholesterol levels. These may be checked every 5 years starting at age 20. Hepatitis C test. Hepatitis B test. Screening Diabetes screening. This is done by checking your blood sugar (glucose) after you have not eaten for a while (fasting). STD (sexually transmitted disease) testing, if you are at risk. BRCA-related cancer screening. This may be done if you have a family history of breast, ovarian, tubal, or  peritoneal cancers. Pelvic exam and Pap test. This may be done every 3 years starting at age 21. Starting at age 30, this may be done every 5 years if you have a Pap test in combination with an HPV test. Talk with your health care provider about your test results, treatment options,and if necessary, the need for more tests. Follow these instructions at home: Eating and drinking  Eat a healthy diet that includes fresh fruits and vegetables, whole grains, lean protein, and low-fat dairy products. Take vitamin and mineral supplements as recommended by your health care provider. Do not drink alcohol if: Your health care provider tells you not to drink. You are pregnant, may be pregnant, or are planning to become pregnant. If you drink alcohol: Limit how much you have to 0-1 drink a day. Be aware of how much alcohol is in your drink. In the [place] one drink equals one 12 oz bottle of beer (355 mL), one 5 oz glass of wine (148 mL), or one 1 oz glass of hard liquor (44 mL).  Lifestyle Take daily care of your teeth and gums. Brush your teeth every morning and night with fluoride toothpaste. Floss one time each day. Stay active. Exercise for at least 30 minutes 5 or more days each week. Do not use any products that contain nicotine or tobacco, such as cigarettes, e-cigarettes, and chewing tobacco. If you need help quitting, ask your health care provider. Do not use drugs. If you are sexually active, practice safe sex. Use a condom or other form of protection to prevent STIs (sexually transmitted infections). If you do not wish to become pregnant, use a form of birth control. If you plan to become pregnant, see your health care   provider for a prepregnancy visit. Find healthy ways to cope with stress, such as: Meditation, yoga, or listening to music. Journaling. Talking to a trusted person. Spending time with friends and family. Safety Always wear your seat belt while driving or riding in a  vehicle. Do not drive: If you have been drinking alcohol. Do not ride with someone who has been drinking. When you are tired or distracted. While texting. Wear a helmet and other protective equipment during sports activities. If you have firearms in your house, make sure you follow all gun safety procedures. Seek help if you have been physically or sexually abused. What's next? Go to your health care provider once a year for an annual wellness visit. Ask your health care provider how often you should have your eyes and teeth checked. Stay up to date on all vaccines. This information is not intended to replace advice given to you by your health care provider. Make sure you discuss any questions you have with your healthcare provider. Document Revised: 07/06/2020 Document Reviewed: 07/20/2018 Elsevier Patient Education  2022 [employer].

## 2021-06-05 NOTE — Assessment & Plan Note (Signed)
Discussed importance of healthy weight management Discussed diet and exercise  Taking phentermine currently per Endo Discussed beenfits of [facility]

## 2021-06-06 LAB — CBC WITH DIFFERENTIAL/PLATELET
Basophils Absolute: 0 10*3/uL (ref 0.0–0.2)
Basos: 1 %
EOS (ABSOLUTE): 0.1 10*3/uL (ref 0.0–0.4)
Eos: 1 %
Hematocrit: 41.5 % (ref 34.0–46.6)
Hemoglobin: 13.7 g/dL (ref 11.1–15.9)
Immature Grans (Abs): 0 10*3/uL (ref 0.0–0.1)
Immature Granulocytes: 0 %
Lymphocytes Absolute: 2.3 10*3/uL (ref 0.7–3.1)
Lymphs: 35 %
MCH: 29 pg (ref 26.6–33.0)
MCHC: 33 g/dL (ref 31.5–35.7)
MCV: 88 fL (ref 79–97)
Monocytes Absolute: 0.4 10*3/uL (ref 0.1–0.9)
Monocytes: 6 %
Neutrophils Absolute: 3.7 10*3/uL (ref 1.4–7.0)
Neutrophils: 57 %
Platelets: 160 10*3/uL (ref 150–450)
RBC: 4.72 x10E6/uL (ref 3.77–5.28)
RDW: 12.9 % (ref 11.7–15.4)
WBC: 6.5 10*3/uL (ref 3.4–10.8)

## 2021-06-06 LAB — LIPID PANEL
Chol/HDL Ratio: 4.2 ratio (ref 0.0–4.4)
Cholesterol, Total: 224 mg/dL — ABNORMAL HIGH (ref 100–199)
HDL: 53 mg/dL (ref >39)
LDL Chol Calc (NIH): 162 mg/dL — ABNORMAL HIGH (ref 0–99)
Triglycerides: 53 mg/dL (ref 0–149)
VLDL Cholesterol Cal: 9 mg/dL (ref 5–40)

## 2021-06-06 LAB — COMPREHENSIVE METABOLIC PANEL
ALT: 11 IU/L (ref 0–32)
AST: 24 IU/L (ref 0–40)
Albumin/Globulin Ratio: 1.5 (ref 1.2–2.2)
Albumin: 4.5 g/dL (ref 3.8–4.8)
Alkaline Phosphatase: 78 IU/L (ref 44–121)
BUN/Creatinine Ratio: 12 (ref 9–23)
BUN: 11 mg/dL (ref 6–20)
Bilirubin Total: <0.2 mg/dL (ref 0.0–1.2)
CO2: 21 mmol/L (ref 20–29)
Calcium: 9.4 mg/dL (ref 8.7–10.2)
Chloride: 101 mmol/L (ref 96–106)
Creatinine, Ser: 0.89 mg/dL (ref 0.57–1.00)
Globulin, Total: 3 g/dL (ref 1.5–4.5)
Glucose: 97 mg/dL (ref 65–99)
Potassium: 4.2 mmol/L (ref 3.5–5.2)
Sodium: 136 mmol/L (ref 134–144)
Total Protein: 7.5 g/dL (ref 6.0–8.5)
eGFR: 86 mL/min/{1.73_m2} (ref >59)

## 2021-06-06 LAB — VITAMIN D 25 HYDROXY (VIT D DEFICIENCY, FRACTURES): Vit D, 25-Hydroxy: 60.8 ng/mL (ref 30.0–100.0)

## 2021-06-06 LAB — VITAMIN B12: Vitamin B-12: 372 pg/mL (ref 232–1245)

## 2021-07-20 ENCOUNTER — Encounter: Payer: Self-pay | Admitting: Family Medicine

## 2021-07-20 MED ORDER — LINACLOTIDE 72 MCG PO CAPS: 72.0000 ug | ORAL_CAPSULE | Freq: Every day | ORAL | 5 refills | Status: DC

## 2021-08-03 DIAGNOSIS — L209 Atopic dermatitis, unspecified: Secondary | ICD-10-CM | POA: Diagnosis not present

## 2021-08-03 DIAGNOSIS — J3081 Allergic rhinitis due to animal (cat) (dog) hair and dander: Secondary | ICD-10-CM | POA: Diagnosis not present

## 2021-08-03 DIAGNOSIS — J3089 Other allergic rhinitis: Secondary | ICD-10-CM | POA: Diagnosis not present

## 2021-08-03 DIAGNOSIS — J301 Allergic rhinitis due to pollen: Secondary | ICD-10-CM | POA: Diagnosis not present

## 2021-11-19 DIAGNOSIS — E559 Vitamin D deficiency, unspecified: Secondary | ICD-10-CM | POA: Diagnosis not present

## 2021-11-19 DIAGNOSIS — E039 Hypothyroidism, unspecified: Secondary | ICD-10-CM | POA: Diagnosis not present

## 2022-01-27 ENCOUNTER — Encounter: Payer: Self-pay | Admitting: Family Medicine

## 2022-01-27 MED ORDER — ALBUTEROL SULFATE (2.5 MG/3ML) 0.083% IN NEBU: 2.5000 mg | INHALATION_SOLUTION | Freq: Four times a day (QID) | RESPIRATORY_TRACT | 1 refills | Status: DC | PRN

## 2022-01-27 MED ORDER — ALBUTEROL SULFATE HFA 108 (90 BASE) MCG/ACT IN AERS: INHALATION_SPRAY | RESPIRATORY_TRACT | 2 refills | Status: AC

## 2022-05-20 DIAGNOSIS — E039 Hypothyroidism, unspecified: Secondary | ICD-10-CM | POA: Diagnosis not present

## 2022-05-20 DIAGNOSIS — E049 Nontoxic goiter, unspecified: Secondary | ICD-10-CM | POA: Diagnosis not present

## 2022-05-20 DIAGNOSIS — E559 Vitamin D deficiency, unspecified: Secondary | ICD-10-CM | POA: Diagnosis not present

## 2022-06-07 ENCOUNTER — Encounter: Payer: Self-pay | Admitting: Family Medicine

## 2022-06-15 ENCOUNTER — Ambulatory Visit (INDEPENDENT_AMBULATORY_CARE_PROVIDER_SITE_OTHER): Payer: BC Managed Care – PPO | Admitting: Family Medicine

## 2022-06-15 ENCOUNTER — Encounter: Payer: Self-pay | Admitting: Family Medicine

## 2022-06-15 ENCOUNTER — Other Ambulatory Visit (HOSPITAL_COMMUNITY)
Admission: RE | Admit: 2022-06-15 | Discharge: 2022-06-15 | Disposition: A | Discharge status: Home / Self Care | Payer: BC Managed Care – PPO | Source: Ambulatory Visit | Attending: Family Medicine | Admitting: Family Medicine

## 2022-06-15 VITALS — BP 112/80 | HR 90 | Temp 98.0°F | Resp 16 | Ht 64.0 in | Wt 228.0 lb

## 2022-06-15 DIAGNOSIS — D693 Immune thrombocytopenic purpura: Secondary | ICD-10-CM

## 2022-06-15 DIAGNOSIS — E6609 Other obesity due to excess calories: Secondary | ICD-10-CM | POA: Diagnosis not present

## 2022-06-15 DIAGNOSIS — Z23 Encounter for immunization: Secondary | ICD-10-CM | POA: Diagnosis not present

## 2022-06-15 DIAGNOSIS — Z0000 Encounter for general adult medical examination without abnormal findings: Secondary | ICD-10-CM | POA: Diagnosis not present

## 2022-06-15 DIAGNOSIS — K5909 Other constipation: Secondary | ICD-10-CM | POA: Diagnosis not present

## 2022-06-15 DIAGNOSIS — L2084 Intrinsic (allergic) eczema: Secondary | ICD-10-CM

## 2022-06-15 DIAGNOSIS — D691 Qualitative platelet defects: Secondary | ICD-10-CM

## 2022-06-15 DIAGNOSIS — E538 Deficiency of other specified B group vitamins: Secondary | ICD-10-CM

## 2022-06-15 DIAGNOSIS — E039 Hypothyroidism, unspecified: Secondary | ICD-10-CM | POA: Diagnosis not present

## 2022-06-15 DIAGNOSIS — Z8639 Personal history of other endocrine, nutritional and metabolic disease: Secondary | ICD-10-CM | POA: Diagnosis not present

## 2022-06-15 MED ORDER — LINACLOTIDE 72 MCG PO CAPS: 72.0000 ug | ORAL_CAPSULE | Freq: Every day | ORAL | 5 refills | Status: DC

## 2022-06-15 NOTE — Assessment & Plan Note (Signed)
Followed by Endo, doing well on Wegovy with weight loss. Continue efforts with diet and exercise.

## 2022-06-15 NOTE — Assessment & Plan Note (Signed)
Recheck plts. Plan to reestablish with Heme if plts <100.

## 2022-06-15 NOTE — Assessment & Plan Note (Signed)
Chronic, well controlled. Doing well on linzess, refill provided.

## 2022-06-15 NOTE — Progress Notes (Signed)
BP 112/80 (BP Location: Left Arm, Patient Position: Sitting, Cuff Size: Large)   Pulse 90   Temp 98 F (36.7 C) (Oral)   Resp 16   Ht '5\' 4"'  (1.626 m)   Wt 228 lb (103.4 kg)   LMP 06/05/2022   BMI 39.14 kg/m    Subjective:    Patient ID: Chelsey Guzman, female    DOB: January 11, 1981, 41 y.o.   MRN: [id number]  HPI: Chelsey Guzman is a 41 y.o. female presenting on 06/15/2022 for comprehensive medical examination. Current medical complaints include:none  Hypothyroidism - follows with Endo, last appt 6/29. - thyroid US 2013 with heterogenous gland c/w thyroiditis.  - Medications: Synthroid 1mg - last TSH wnl  Eczema - on dupixent. Follows with Derm.  Constipation - on linzess. Doing well.   Obesity - on wegovy 2.442m Followed by endo. Has lost 34lbs since last year.  Asthma - albuterol prn. Last used last year with COVID.   Depression Screen done today and results listed below:     06/15/2022    3:03 PM 06/05/2021    9:22 AM 08/21/2020    1:13 PM 06/06/2020    3:07 PM 05/31/2019    3:41 PM  Depression screen PHQ 2/9  Decreased Interest 0 0 0 0 0  Down, Depressed, Hopeless 0 0 0 0 0  PHQ - 2 Score 0 0 0 0 0  Altered sleeping  1 1 0 0  Tired, decreased energy  1 1 0 1  Change in appetite  0 0 0 0  Feeling bad or failure about yourself   0 0 0 0  Trouble concentrating  0 0 0 0  Moving slowly or fidgety/restless  0 0 0 0  Suicidal thoughts  0 0 0 0  PHQ-9 Score  2 2 0 1  Difficult doing work/chores  Not difficult at all Not difficult at all Not difficult at all Not difficult at all    Past Medical History:  Past Medical History:  Diagnosis Date   Abnormal Pap smear    leep   Acne 01/10/2012   Allergy    Asthma    Class 2 obesity due to excess calories without serious comorbidity with body mass index (BMI) of 39.0 to 39.9 in adult 12/24/2013   Clotting disorder ([facility]   Thyroid enlargement 01/10/2012    Surgical History:  Past Surgical History:  Procedure  Laterality Date   btl  2009   TONSILLECTOMY     TUBAL LIGATION      Medications:  Current Outpatient Medications on File Prior to Visit  Medication Sig   albuterol (PROVENTIL) (2.5 MG/3ML) 0.083% nebulizer solution Take 3 mLs (2.5 mg total) by nebulization every 6 (six) hours as needed for wheezing or shortness of breath.   albuterol (VENTOLIN HFA) 108 (90 Base) MCG/ACT inhaler TAKE 2 PUFFS BY MOUTH EVERY 6 HOURS AS NEEDED FOR WHEEZE OR SHORTNESS OF BREATH   dupilumab (DUPIXENT) 300 MG/2ML prefilled syringe Inject into the skin.   levothyroxine (SYNTHROID) 88 MCG tablet Take 88 mcg by mouth daily before breakfast.   Vitamin D, Cholecalciferol, 25 MCG (1000 UT) TABS Take 5,000 Units by mouth.    WEGOVY 2.4 MG/0.75ML SOAJ SMARTSIG:2.4 Milligram(s) SUB-Q Once a Week   No current facility-administered medications on file prior to visit.    Allergies:  No Known Allergies  Social History:  Social History   Socioeconomic History   Marital status: Married    Spouse name: Not on  file   Number of children: 2   Years of education: Not on file   Highest education level: Not on file  Occupational History   Occupation: financial recovery    Employer: [employer]  Tobacco Use   Smoking status: Never   Smokeless tobacco: Never   Tobacco comments:    never used tobacco  Vaping Use   Vaping Use: Never used  Substance and Sexual Activity   Alcohol use: Yes   Drug use: No   Sexual activity: Yes    Partners: Male    Birth control/protection: Surgical  Other Topics Concern   Not on file  Social History Narrative   Not on file   Social Determinants of Health   Financial Resource Strain: Not on file  Food Insecurity: Not on file  Transportation Needs: Not on file  Physical Activity: Not on file  Stress: Not on file  Social Connections: Not on file  Intimate Partner Violence: Not on file   Social History   Tobacco Use  Smoking Status Never  Smokeless Tobacco Never   Tobacco Comments   never used tobacco   Social History   Substance and Sexual Activity  Alcohol Use Yes    Family History:  Family History  Problem Relation Age of Onset   Diabetes Sister    Hypertension Mother    Depression Mother    Colon cancer Neg Hx    Breast cancer Neg Hx     Past medical history, surgical history, medications, allergies, family history and social history reviewed with patient today and changes made to appropriate areas of the chart.      Objective:    BP 112/80 (BP Location: Left Arm, Patient Position: Sitting, Cuff Size: Large)   Pulse 90   Temp 98 F (36.7 C) (Oral)   Resp 16   Ht '5\' 4"'  (1.626 m)   Wt 228 lb (103.4 kg)   LMP 06/05/2022   BMI 39.14 kg/m   Wt Readings from Last 3 Encounters:  06/15/22 228 lb (103.4 kg)  06/05/21 262 lb 14.4 oz (119.3 kg)  08/21/20 263 lb (119.3 kg)    Physical Exam Vitals reviewed. Exam conducted with a chaperone present.  Constitutional:      Appearance: Normal appearance.  HENT:     Head: Normocephalic.     Right Ear: External ear normal.     Left Ear: External ear normal.     Nose: Nose normal.     Mouth/Throat:     Mouth: Mucous membranes are moist.     Pharynx: Oropharynx is clear.  Eyes:     Extraocular Movements: Extraocular movements intact.     Pupils: Pupils are equal, round, and reactive to light.  Cardiovascular:     Rate and Rhythm: Normal rate and regular rhythm.     Heart sounds: Normal heart sounds. No murmur heard. Pulmonary:     Effort: Pulmonary effort is normal.     Breath sounds: Normal breath sounds.  Abdominal:     General: Bowel sounds are normal.     Palpations: Abdomen is soft.  Genitourinary:    General: Normal vulva.     Vagina: Normal.     Cervix: Normal.     Uterus: Normal.      Adnexa: Right adnexa normal and left adnexa normal.     Comments: Cervical ectropion Musculoskeletal:        General: Normal range of motion.     Right lower leg: No  edema.      Left lower leg: No edema.  Skin:    General: Skin is warm and dry.  Neurological:     Mental Status: She is alert and oriented to person, place, and time. Mental status is at baseline.  Psychiatric:        Mood and Affect: Mood normal.        Behavior: Behavior normal.     Results for orders placed or performed in visit on 06/05/21  Lipid panel  Result Value Ref Range   Cholesterol, Total 224 (H) 100 - 199 mg/dL   Triglycerides 53 0 - 149 mg/dL   HDL 53 >39 mg/dL   VLDL Cholesterol Cal 9 5 - 40 mg/dL   LDL Chol Calc (NIH) 162 (H) 0 - 99 mg/dL   Chol/HDL Ratio 4.2 0.0 - 4.4 ratio  Comprehensive metabolic panel  Result Value Ref Range   Glucose 97 65 - 99 mg/dL   BUN 11 6 - 20 mg/dL   Creatinine, Ser 0.89 0.57 - 1.00 mg/dL   eGFR 86 >59 mL/min/1.73   BUN/Creatinine Ratio 12 9 - 23   Sodium 136 134 - 144 mmol/L   Potassium 4.2 3.5 - 5.2 mmol/L   Chloride 101 96 - 106 mmol/L   CO2 21 20 - 29 mmol/L   Calcium 9.4 8.7 - 10.2 mg/dL   Total Protein 7.5 6.0 - 8.5 g/dL   Albumin 4.5 3.8 - 4.8 g/dL   Globulin, Total 3.0 1.5 - 4.5 g/dL   Albumin/Globulin Ratio 1.5 1.2 - 2.2   Bilirubin Total <0.2 0.0 - 1.2 mg/dL   Alkaline Phosphatase 78 44 - 121 IU/L   AST 24 0 - 40 IU/L   ALT 11 0 - 32 IU/L  CBC w/Diff/Platelet  Result Value Ref Range   WBC 6.5 3.4 - 10.8 x10E3/uL   RBC 4.72 3.77 - 5.28 x10E6/uL   Hemoglobin 13.7 11.1 - 15.9 g/dL   Hematocrit 41.5 34.0 - 46.6 %   MCV 88 79 - 97 fL   MCH 29.0 26.6 - 33.0 pg   MCHC 33.0 31.5 - 35.7 g/dL   RDW 12.9 11.7 - 15.4 %   Platelets 160 150 - 450 x10E3/uL   Neutrophils 57 Not Estab. %   Lymphs 35 Not Estab. %   Monocytes 6 Not Estab. %   Eos 1 Not Estab. %   Basos 1 Not Estab. %   Neutrophils Absolute 3.7 1.4 - 7.0 x10E3/uL   Lymphocytes Absolute 2.3 0.7 - 3.1 x10E3/uL   Monocytes Absolute 0.4 0.1 - 0.9 x10E3/uL   EOS (ABSOLUTE) 0.1 0.0 - 0.4 x10E3/uL   Basophils Absolute 0.0 0.0 - 0.2 x10E3/uL   Immature Granulocytes 0 Not  Estab. %   Immature Grans (Abs) 0.0 0.0 - 0.1 x10E3/uL  B12  Result Value Ref Range   Vitamin B-12 372 232 - 1,245 pg/mL  VITAMIN D 25 Hydroxy (Vit-D Deficiency, Fractures)  Result Value Ref Range   Vit D, 25-Hydroxy 60.8 30.0 - 100.0 ng/mL      Assessment & Plan:   Problem List Items Addressed This Visit       Digestive   Chronic constipation    Chronic, well controlled. Doing well on linzess, refill provided.         Endocrine   Acquired hypothyroidism    Followed by Endo, last TSH wnl.         Musculoskeletal and Integument   Chronic ITP (idiopathic thrombocytopenia) (HCC)  Recheck plts. Plan to reestablish with Heme if plts <100.      Eczema    Doing well on dupixent. Continue to follow with Derm.        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 Continue efforts with diet and exercise.       Relevant Medications   WEGOVY 2.4 MG/0.75ML SOAJ   Other Visit Diagnoses     Annual physical exam    -  Primary   Relevant Orders   Comprehensive metabolic panel   Lipid panel   CBC with Differential   Hemoglobin A1c   Cytology - PAP   B12 deficiency       Relevant Orders   B12        Follow up plan: Return in about 1 year (around 06/16/2023) for cpe.   LABORATORY TESTING:  - Pap smear: due October, done today. Previously s/p LEEP ~2009.  IMMUNIZATIONS:   - Tdap: Tetanus vaccination status reviewed: Td vaccination indicated and given today. - Influenza: Postponed to flu season - Pneumovax: Not applicable - Prevnar: Not applicable - HPV: Not applicable - Shingrix vaccine: Not applicable - COVID vaccine: has received 2 doses of mRNA vaccine  SCREENING: - Mammogram: Not applicable  - Colonoscopy: Not applicable  - Bone Density: Not applicable  - Lung Cancer  Screening: Not applicable   Hep C Screening: UTD STD testing and prevention (HIV/chl/gon/syphilis): no concerns Sexual History : monogamous Menstrual History/LMP/Abnormal Bleeding: regular, BTL 2009 Incontinence Symptoms: none  Osteoporosis: Discussed high calcium and vitamin D supplementation, weight bearing exercises  Advanced Care Planning: A voluntary discussion about advance care planning including the explanation and discussion of advance directives.  Discussed health care proxy and Living will, and the patient was able to identify a health care proxy as husband, travis Chishom.  Patient does not have a living will at present time. If patient does have living will, I have requested they bring this to the clinic to be scanned in to their chart.  PATIENT COUNSELING:   Advised to take 1 mg of folate supplement per day if capable of pregnancy.   Sexuality: Discussed sexually transmitted diseases, partner selection, use of condoms, avoidance of unintended pregnancy  and contraceptive alternatives.   Advised to avoid cigarette smoking.  I discussed with the patient that most people either abstain from alcohol or drink within safe limits (<=14/week and <=4 drinks/occasion for males, <=7/weeks and <= 3 drinks/occasion for females) and that the risk for alcohol disorders and other health effects rises proportionally with the number of drinks per week and how often a drinker exceeds daily limits.  Discussed cessation/primary prevention of drug use and availability of treatment for abuse.   Diet: Encouraged to adjust caloric intake to maintain  or achieve ideal body weight, to reduce intake of dietary saturated fat and total fat, to limit sodium intake by avoiding high sodium foods and not adding table salt, and to maintain adequate dietary potassium and calcium preferably from fresh fruits, vegetables, and low-fat dairy products.    Stressed the importance of regular exercise  Injury  prevention: Discussed safety belts, safety helmets, smoke detector, smoking near bedding or upholstery.   Dental health: Discussed importance of regular tooth brushing, flossing, and  dental visits.    NEXT PREVENTATIVE PHYSICAL DUE IN 1 YEAR. Return in about 1 year (around 06/16/2023) for cpe.

## 2022-06-15 NOTE — Assessment & Plan Note (Signed)
Recheck b12 levels

## 2022-06-15 NOTE — Assessment & Plan Note (Signed)
Doing well on dupixent. Continue to follow with Derm.

## 2022-06-15 NOTE — Assessment & Plan Note (Signed)
Followed by Endo, last TSH wnl.

## 2022-06-15 NOTE — Patient Instructions (Signed)
Things to do to keep yourself healthy  - Exercise at least 30-45 minutes a day, 3-4 days a week.  - Eat a low-fat diet with lots of fruits and vegetables, up to 7-9 servings per day.  - Seatbelts can save your life. Wear them always.  - Smoke detectors on every level of your home, check batteries every year.  - Eye Doctor - have an eye exam every 1-2 years  - Safe sex - if you may be exposed to STDs, use a condom.  - Alcohol -  If you drink, do it moderately, less than 2 drinks per day.  - Health Care Power of Attorney. Choose someone to speak for you if you are not able. https://www.prepareforyourcare.org is a great website to help you navigate this. - Depression is common in our stressful world.If you're feeling down or losing interest in things you normally enjoy, please come in for a visit.  - Violence - If anyone is threatening or hurting you, please call immediately.  Here is an example of what a healthy plate looks like:    ? Make half your plate fruits and vegetables.     ? Focus on whole fruits.     ? Vary your veggies.  ? Make half your grains whole grains. -     ? Look for the word "whole" at the beginning of the ingredients list    ? Some whole-grain ingredients include whole oats, whole-wheat flour,        whole-grain corn, whole-grain brown rice, and whole rye.  ? Move to low-fat and fat-free milk or yogurt.  ? Vary your protein routine. - Meat, fish, poultry (chicken, turkey), eggs, beans (kidney, pinto), dairy.  ? Drink and eat less sodium, saturated fat, and added sugars.  Look for opportunities to move your body throughout your day:  Never lie down when you can sit; never sit when you can stand; never stand when you can pace.  Moving your body throughout the day is just as important as the 30 or 60 minutes of exercise at the gym!  Get social Get active with your friends instead of going out to eat. Go for a hike, walk around the mall, or play an exercise-themed  video game.   Move more at work Fit more activity into the workday. Stand during phone calls, use a printer farther from your desk, and get up to stretch each hour.    Do something new Develop a new skill to kick-start your motivation. Sign up for a class to learn how to salsa dance, surf, do tai chi, or play a sport.    Keep cool in the pool Don't like to sweat? Hit the local community pool for a swim, water polo, or water aerobics class to stay cool while exercising.    Stay on track Use a fitness tracker (FITBIT, Fitness Pal mobile app) to track your activity and provide motivation to reach your goals.   

## 2022-06-16 LAB — LIPID PANEL
Chol/HDL Ratio: 4.2 ratio (ref 0.0–4.4)
Cholesterol, Total: 216 mg/dL — ABNORMAL HIGH (ref 100–199)
HDL: 52 mg/dL (ref >39)
LDL Chol Calc (NIH): 153 mg/dL — ABNORMAL HIGH (ref 0–99)
Triglycerides: 65 mg/dL (ref 0–149)
VLDL Cholesterol Cal: 11 mg/dL (ref 5–40)

## 2022-06-16 LAB — CBC WITH DIFFERENTIAL/PLATELET
Basophils Absolute: 0.1 10*3/uL (ref 0.0–0.2)
Basos: 1 %
EOS (ABSOLUTE): 0 10*3/uL (ref 0.0–0.4)
Eos: 1 %
Hematocrit: 40.6 % (ref 34.0–46.6)
Hemoglobin: 13.3 g/dL (ref 11.1–15.9)
Immature Grans (Abs): 0 10*3/uL (ref 0.0–0.1)
Immature Granulocytes: 0 %
Lymphocytes Absolute: 2.7 10*3/uL (ref 0.7–3.1)
Lymphs: 40 %
MCH: 29 pg (ref 26.6–33.0)
MCHC: 32.8 g/dL (ref 31.5–35.7)
MCV: 89 fL (ref 79–97)
Monocytes Absolute: 0.4 10*3/uL (ref 0.1–0.9)
Monocytes: 6 %
Neutrophils Absolute: 3.6 10*3/uL (ref 1.4–7.0)
Neutrophils: 52 %
Platelets: 156 10*3/uL (ref 150–450)
RBC: 4.58 x10E6/uL (ref 3.77–5.28)
RDW: 12.7 % (ref 11.7–15.4)
WBC: 6.8 10*3/uL (ref 3.4–10.8)

## 2022-06-16 LAB — COMPREHENSIVE METABOLIC PANEL
ALT: 12 IU/L (ref 0–32)
AST: 18 IU/L (ref 0–40)
Albumin/Globulin Ratio: 1.4 (ref 1.2–2.2)
Albumin: 4.3 g/dL (ref 3.9–4.9)
Alkaline Phosphatase: 76 IU/L (ref 44–121)
BUN/Creatinine Ratio: 11 (ref 9–23)
BUN: 10 mg/dL (ref 6–20)
Bilirubin Total: 0.4 mg/dL (ref 0.0–1.2)
CO2: 24 mmol/L (ref 20–29)
Calcium: 9.6 mg/dL (ref 8.7–10.2)
Chloride: 100 mmol/L (ref 96–106)
Creatinine, Ser: 0.94 mg/dL (ref 0.57–1.00)
Globulin, Total: 3 g/dL (ref 1.5–4.5)
Glucose: 86 mg/dL (ref 70–99)
Potassium: 4.4 mmol/L (ref 3.5–5.2)
Sodium: 138 mmol/L (ref 134–144)
Total Protein: 7.3 g/dL (ref 6.0–8.5)
eGFR: 80 mL/min/{1.73_m2} (ref >59)

## 2022-06-16 LAB — HEMOGLOBIN A1C
Est. average glucose Bld gHb Est-mCnc: 103 mg/dL
Hgb A1c MFr Bld: 5.2 % (ref 4.8–5.6)

## 2022-06-16 LAB — VITAMIN B12: Vitamin B-12: 234 pg/mL (ref 232–1245)

## 2022-06-21 LAB — CYTOLOGY - PAP
Chlamydia: NEGATIVE
Comment: NEGATIVE
Comment: NEGATIVE
Comment: NEGATIVE
Comment: NORMAL
Diagnosis: NEGATIVE
High risk HPV: NEGATIVE
Neisseria Gonorrhea: NEGATIVE
Trichomonas: NEGATIVE

## 2022-06-24 ENCOUNTER — Other Ambulatory Visit: Payer: Self-pay | Admitting: Family Medicine

## 2022-07-04 DIAGNOSIS — M545 Low back pain, unspecified: Secondary | ICD-10-CM | POA: Diagnosis not present

## 2022-08-10 IMAGING — US US THYROID
1 series · 14 of 25 positions shown · non-contrast
Comparison: None.

CLINICAL DATA: Goiter.

EXAM:
THYROID ULTRASOUND
TECHNIQUE: Ultrasound examination of the thyroid gland and adjacent soft
tissues was performed.

[Series 1: us thyroid · 0.07mm/px · 14 of 46 slices shown]
[im 1/46]
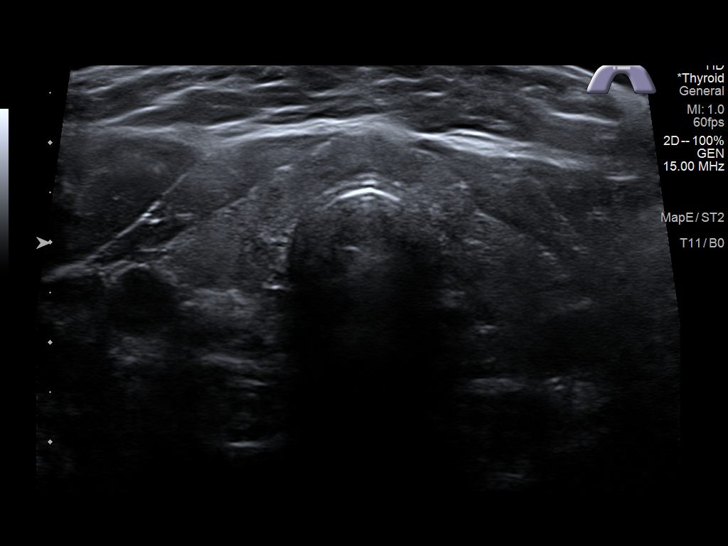
[im 4/46]
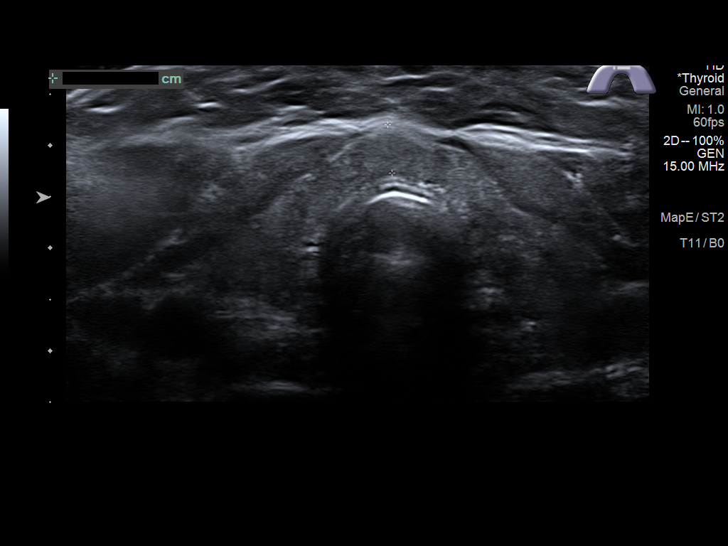
[im 8/46]
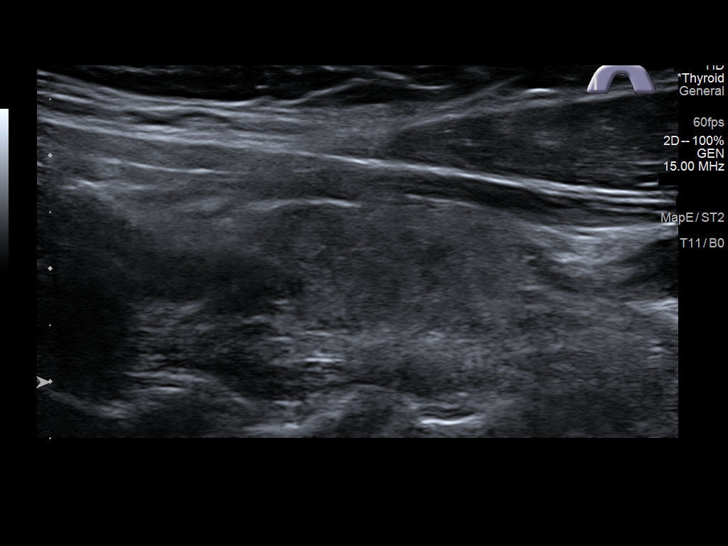
[im 12/46]
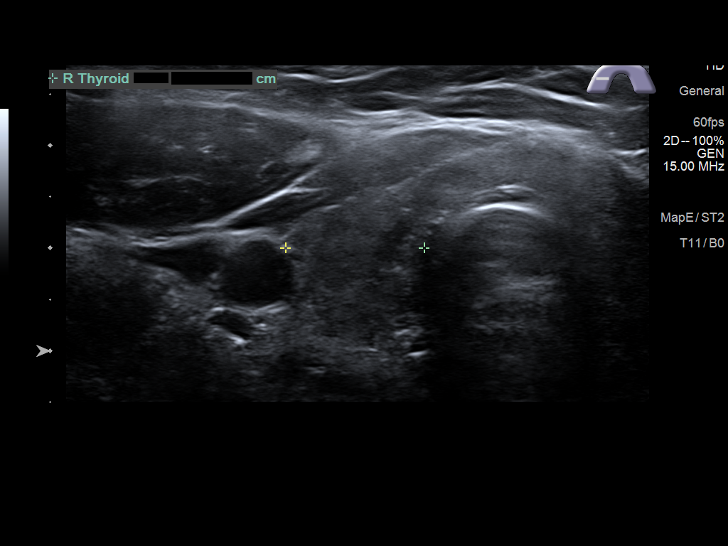
[im 16/46]
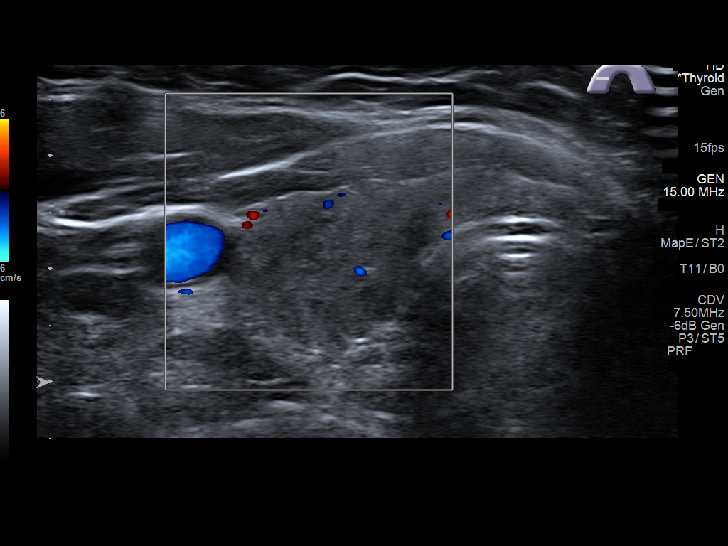
[im 17/46]
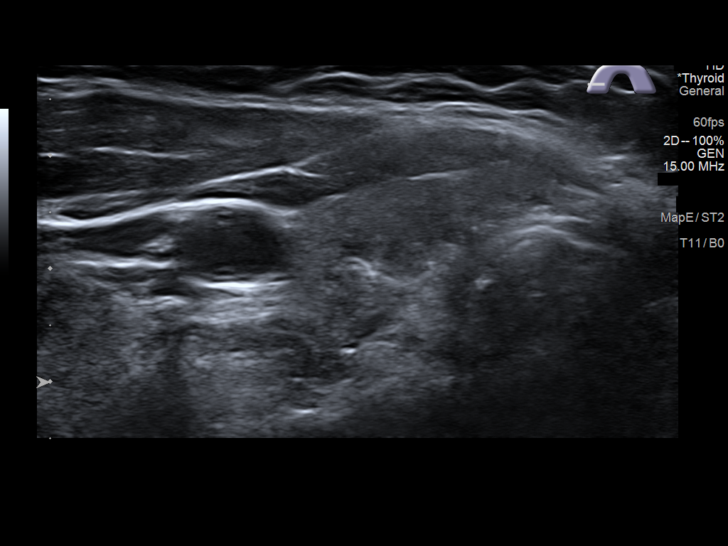
[im 21/46]
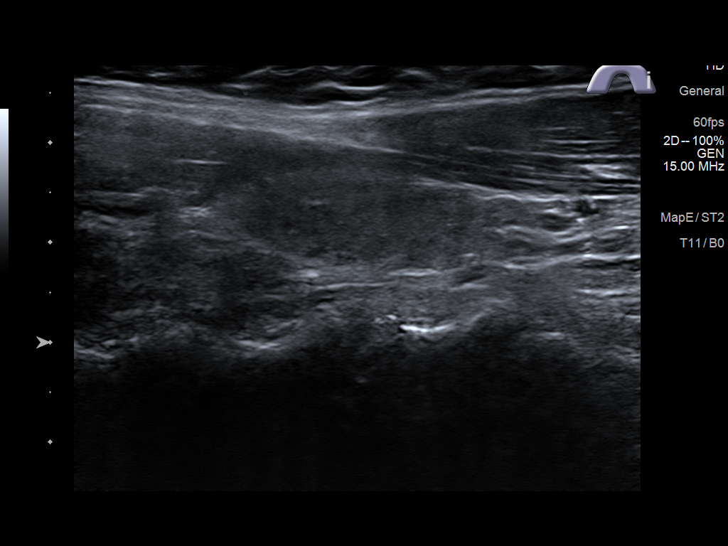
[im 25/46]
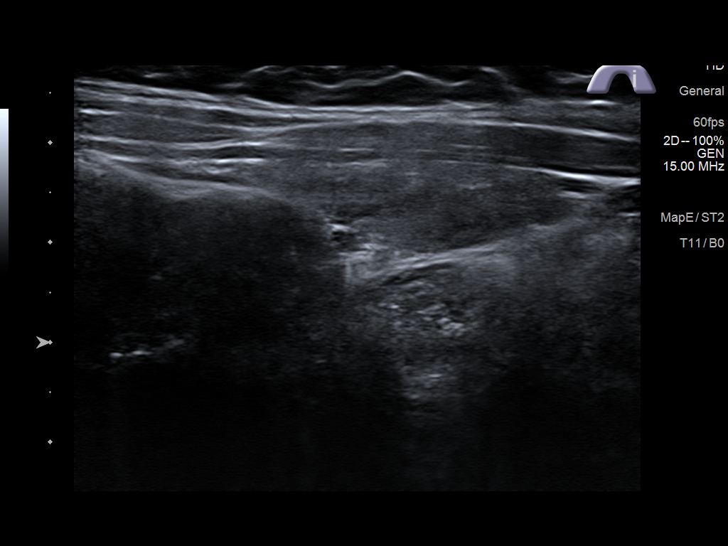
[im 29/46]
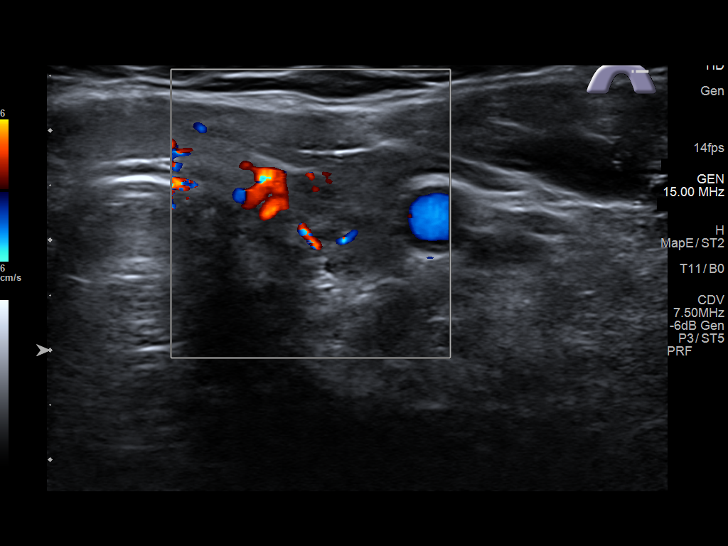
[im 31/46]
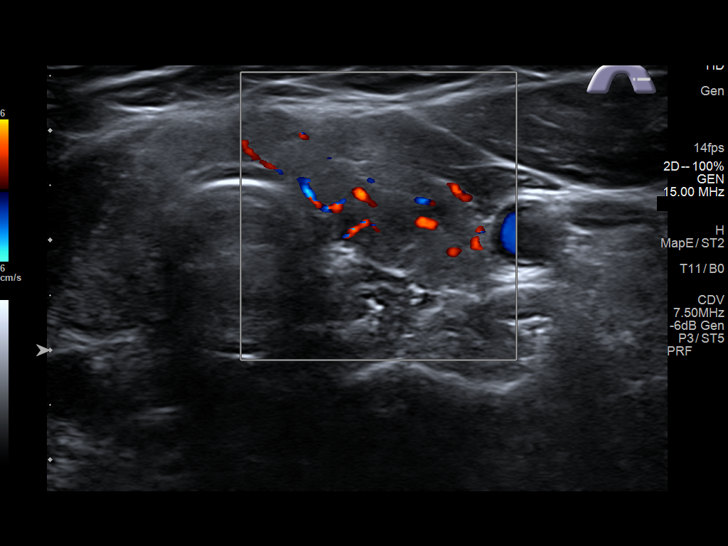
[im 34/46]
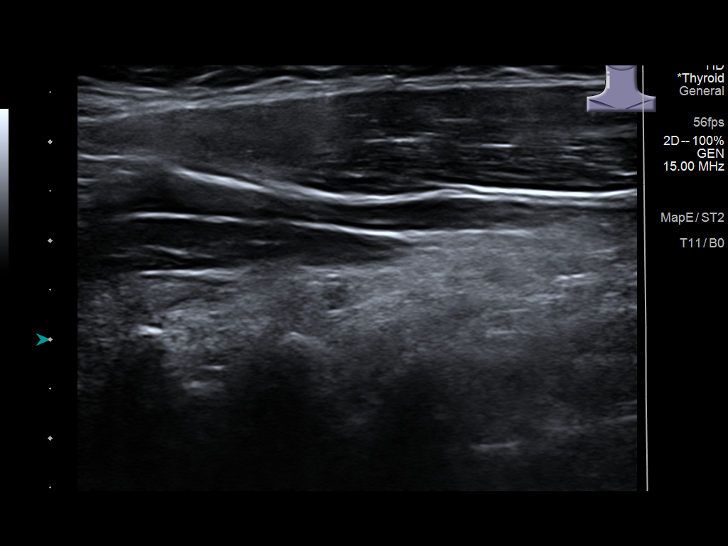
[im 38/46]
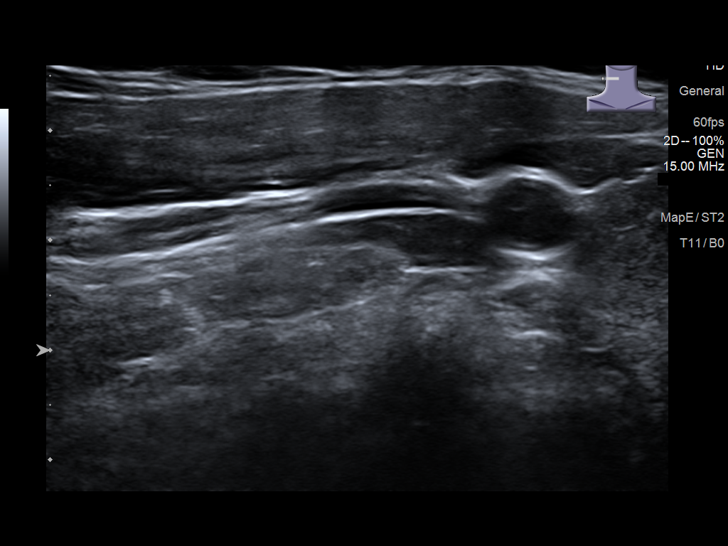
[im 42/46]
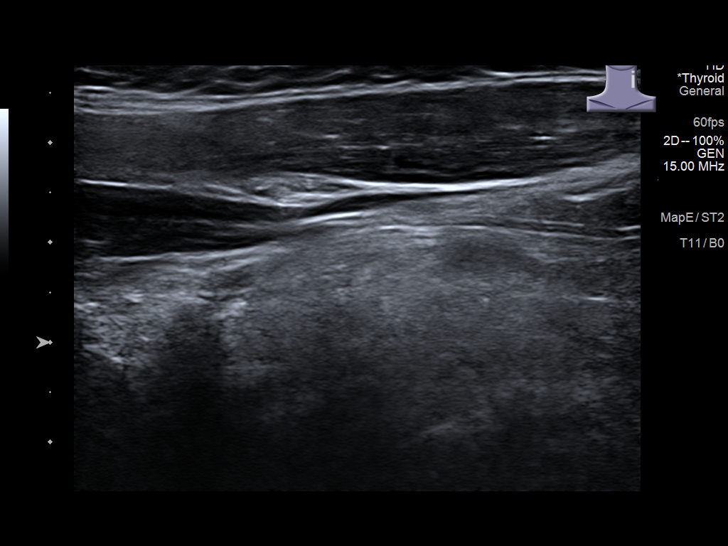
[im 46/46]
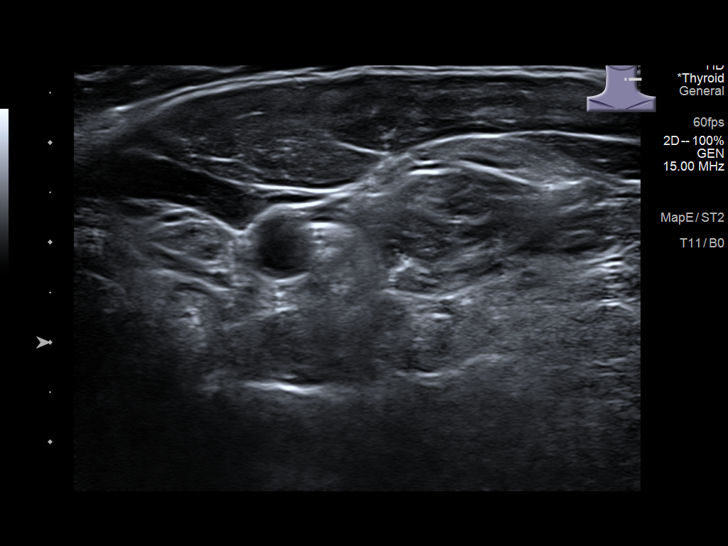

[14 of 25 positions shown; findings below may reference images not displayed]

FINDINGS: Parenchymal Echotexture: Moderately heterogenous

Isthmus: 0.5 cm

Right lobe: 4.0 x 1.2 x 1.4 cm

Left lobe: 3.7 x 1.0 x 1.5 cm

_________________________________________________________

Estimated total number of nodules >/= 1 cm: 0

Number of spongiform nodules >/=  2 cm not described below (TR1): 0

Number of mixed cystic and solid nodules >/= 1.5 cm not described
below (TR2): 0

_________________________________________________________

No discrete nodules are seen within the thyroid gland.
IMPRESSION: Normal sized, moderately heterogeneous thyroid gland. No discrete
nodules are identified.

## 2022-08-12 DIAGNOSIS — J3081 Allergic rhinitis due to animal (cat) (dog) hair and dander: Secondary | ICD-10-CM | POA: Diagnosis not present

## 2022-08-12 DIAGNOSIS — J301 Allergic rhinitis due to pollen: Secondary | ICD-10-CM | POA: Diagnosis not present

## 2022-08-12 DIAGNOSIS — L209 Atopic dermatitis, unspecified: Secondary | ICD-10-CM | POA: Diagnosis not present

## 2022-08-12 DIAGNOSIS — J3089 Other allergic rhinitis: Secondary | ICD-10-CM | POA: Diagnosis not present

## 2023-01-26 ENCOUNTER — Encounter: Payer: Self-pay | Admitting: Family Medicine

## 2023-01-26 DIAGNOSIS — E039 Hypothyroidism, unspecified: Secondary | ICD-10-CM | POA: Diagnosis not present

## 2023-01-26 DIAGNOSIS — E559 Vitamin D deficiency, unspecified: Secondary | ICD-10-CM | POA: Diagnosis not present

## 2023-01-27 MED ORDER — LINACLOTIDE 72 MCG PO CAPS: 72.0000 ug | ORAL_CAPSULE | Freq: Every day | ORAL | 0 refills | Status: DC

## 2023-04-24 ENCOUNTER — Other Ambulatory Visit: Payer: Self-pay | Admitting: Family Medicine

## 2023-09-22 DIAGNOSIS — J3089 Other allergic rhinitis: Secondary | ICD-10-CM | POA: Diagnosis not present

## 2023-09-22 DIAGNOSIS — L209 Atopic dermatitis, unspecified: Secondary | ICD-10-CM | POA: Diagnosis not present

## 2023-09-22 DIAGNOSIS — J3081 Allergic rhinitis due to animal (cat) (dog) hair and dander: Secondary | ICD-10-CM | POA: Diagnosis not present

## 2023-09-22 DIAGNOSIS — J301 Allergic rhinitis due to pollen: Secondary | ICD-10-CM | POA: Diagnosis not present

## 2023-10-06 DIAGNOSIS — E039 Hypothyroidism, unspecified: Secondary | ICD-10-CM | POA: Diagnosis not present

## 2023-10-06 DIAGNOSIS — E559 Vitamin D deficiency, unspecified: Secondary | ICD-10-CM | POA: Diagnosis not present

## 2023-10-30 DIAGNOSIS — H6691 Otitis media, unspecified, right ear: Secondary | ICD-10-CM | POA: Diagnosis not present

## 2024-01-10 ENCOUNTER — Ambulatory Visit (INDEPENDENT_AMBULATORY_CARE_PROVIDER_SITE_OTHER): Payer: BC Managed Care – PPO | Admitting: Family Medicine

## 2024-01-10 ENCOUNTER — Encounter: Payer: Self-pay | Admitting: Family Medicine

## 2024-01-10 VITALS — BP 121/79 | HR 81 | Ht 65.0 in | Wt 238.6 lb

## 2024-01-10 DIAGNOSIS — E782 Mixed hyperlipidemia: Secondary | ICD-10-CM | POA: Diagnosis not present

## 2024-01-10 DIAGNOSIS — Z6839 Body mass index (BMI) 39.0-39.9, adult: Secondary | ICD-10-CM

## 2024-01-10 DIAGNOSIS — E039 Hypothyroidism, unspecified: Secondary | ICD-10-CM

## 2024-01-10 DIAGNOSIS — R739 Hyperglycemia, unspecified: Secondary | ICD-10-CM | POA: Diagnosis not present

## 2024-01-10 DIAGNOSIS — E559 Vitamin D deficiency, unspecified: Secondary | ICD-10-CM | POA: Diagnosis not present

## 2024-01-10 DIAGNOSIS — D693 Immune thrombocytopenic purpura: Secondary | ICD-10-CM

## 2024-01-10 DIAGNOSIS — Z1231 Encounter for screening mammogram for malignant neoplasm of breast: Secondary | ICD-10-CM

## 2024-01-10 DIAGNOSIS — Z8639 Personal history of other endocrine, nutritional and metabolic disease: Secondary | ICD-10-CM | POA: Diagnosis not present

## 2024-01-10 DIAGNOSIS — Z0001 Encounter for general adult medical examination with abnormal findings: Secondary | ICD-10-CM

## 2024-01-10 DIAGNOSIS — E010 Iodine-deficiency related diffuse (endemic) goiter: Secondary | ICD-10-CM

## 2024-01-10 DIAGNOSIS — Z0000 Encounter for general adult medical examination without abnormal findings: Secondary | ICD-10-CM | POA: Diagnosis not present

## 2024-01-10 NOTE — Progress Notes (Signed)
 Complete physical exam   Patient: Chelsey Guzman   DOB: 03-12-1981   43 y.o. Female  MRN: [id number] Visit Date: 01/10/2024  Today's healthcare provider: Shirlee Latch, MD   Chief Complaint  Patient presents with   Annual Exam    Last completed 06/15/22 Diet - General, well balanced Exercise - walking at least 3 times a week at least 30 minutes and depending on the weather Feeling - well Sleeping - fairly well due to waking up at night and having a hard time going back to sleep Concerns - none    Subjective    Chelsey Guzman is a 43 y.o. female who presents today for a complete physical exam.   Discussed the use of AI scribe software for clinical note transcription with the patient, who gave verbal consent to proceed.  History of Present Illness   The patient, with a history of thyroid issues, presents for a physical examination. She reports that her thyroid condition is managed by an endocrinologist with Synthroid. The patient was previously on [facility] for weight loss but discontinued due to severe stomach pains. She is considering restarting Wegovy or trying a different weight loss medication, but insurance coverage is a concern. The patient also mentions a history of low vitamin D and B12 levels. She has a large thyroid that is controlled but has not reduced in size. The patient also mentions bruising, which may be related to low platelet levels. The patient is due for routine health maintenance screenings, including a mammogram.        Last depression screening scores    01/10/2024    8:31 AM 06/15/2022    3:03 PM 06/05/2021    9:22 AM  PHQ 2/9 Scores  PHQ - 2 Score  0 0  PHQ- 9 Score   2  Exception Documentation Patient refusal     Last fall risk screening    06/15/2022    3:02 PM  Fall Risk   Falls in the past year? 0  Number falls in past yr: 0  Injury with Fall? 0        Medications: Outpatient Medications Prior to Visit  Medication Sig    albuterol (PROVENTIL) (2.5 MG/3ML) 0.083% nebulizer solution Take 3 mLs (2.5 mg total) by nebulization every 6 (six) hours as needed for wheezing or shortness of breath.   albuterol (VENTOLIN HFA) 108 (90 Base) MCG/ACT inhaler TAKE 2 PUFFS BY MOUTH EVERY 6 HOURS AS NEEDED FOR WHEEZE OR SHORTNESS OF BREATH   dupilumab (DUPIXENT) 300 MG/2ML prefilled syringe Inject into the skin.   levothyroxine (SYNTHROID) 88 MCG tablet Take 88 mcg by mouth daily before breakfast.   linaclotide (LINZESS) 72 MCG capsule Take 1 capsule (72 mcg total) by mouth daily before breakfast. Please schedule office visit before any future refill   Vitamin D, Cholecalciferol, 25 MCG (1000 UT) TABS Take 5,000 Units by mouth.    WEGOVY 2.4 MG/0.75ML SOAJ SMARTSIG:2.4 Milligram(s) SUB-Q Once a Week   No facility-administered medications prior to visit.    Review of Systems    Objective    BP 121/79 (BP Location: Left Arm, Patient Position: Sitting, Cuff Size: Large)   Pulse 81   Ht 5\' 5"  (1.651 m)   Wt 238 lb 9.6 oz (108.2 kg)   SpO2 99%   BMI 39.71 kg/m    Physical Exam Vitals reviewed.  Constitutional:      General: She is not in acute distress.    Appearance: Normal appearance.  She is well-developed. She is not diaphoretic.  HENT:     Head: Normocephalic and atraumatic.     Right Ear: Tympanic membrane, ear canal and external ear normal.     Left Ear: Tympanic membrane, ear canal and external ear normal.     Nose: Nose normal.     Mouth/Throat:     Mouth: Mucous membranes are moist.     Pharynx: Oropharynx is clear. No oropharyngeal exudate.  Eyes:     General: No scleral icterus.    Conjunctiva/sclera: Conjunctivae normal.     Pupils: Pupils are equal, round, and reactive to light.  Neck:     Thyroid: Thyromegaly present.  Cardiovascular:     Rate and Rhythm: Normal rate and regular rhythm.     Heart sounds: Normal heart sounds. No murmur heard. Pulmonary:     Effort: Pulmonary effort is normal. No  respiratory distress.     Breath sounds: Normal breath sounds. No wheezing or rales.  Abdominal:     General: There is no distension.     Palpations: Abdomen is soft.     Tenderness: There is no abdominal tenderness.  Musculoskeletal:        General: No deformity.     Cervical back: Neck supple.     Right lower leg: No edema.     Left lower leg: No edema.  Lymphadenopathy:     Cervical: No cervical adenopathy.  Skin:    General: Skin is warm and dry.     Findings: No rash.  Neurological:     Mental Status: She is alert and oriented to person, place, and time. Mental status is at baseline.     Gait: Gait normal.  Psychiatric:        Mood and Affect: Mood normal.        Behavior: Behavior normal.        Thought Content: Thought content normal.      No results found for any visits on 01/10/24.  Assessment & Plan    Routine Health Maintenance and Physical Exam  Exercise Activities and Dietary recommendations  Goals   None     Immunization History  Administered Date(s) Administered   Influenza-Unspecified 09/05/2008   PFIZER(Purple Top)SARS-COV-2 Vaccination 09/10/2020, 10/01/2020   Tdap 01/10/2012, 06/15/2022    Health Maintenance  Topic Date Due   COVID-19 Vaccine (3 - 2024-25 season) 07/24/2023   INFLUENZA VACCINE  02/20/2024 (Originally 06/23/2023)   Cervical Cancer Screening (HPV/Pap Cotest)  06/16/2027   DTaP/Tdap/Td (3 - Td or Tdap) 06/15/2032   Hepatitis C Screening  Completed   HIV Screening  Completed   HPV VACCINES  Aged Out    Discussed health benefits of physical activity, and encouraged her to engage in regular exercise appropriate for her age and condition.  Problem List Items Addressed This Visit       Endocrine   Acquired hypothyroidism     Musculoskeletal and Integument   Chronic ITP (idiopathic thrombocytopenia) (HCC)   Relevant Orders   CBC w/Diff/Platelet     Other   Morbid obesity (HCC)   Hyperlipidemia   Relevant Orders    Comprehensive metabolic panel   Lipid panel   Vitamin D deficiency   Relevant Orders   VITAMIN D 25 Hydroxy (Vit-D Deficiency, Fractures)   History of non anemic vitamin B12 deficiency   Relevant Orders   B12   Other Visit Diagnoses       Encounter for annual physical exam    -  Primary   Relevant Orders   Comprehensive metabolic panel   Lipid panel   VITAMIN D 25 Hydroxy (Vit-D Deficiency, Fractures)   B12   CBC w/Diff/Platelet   Hemoglobin A1c     Hyperglycemia       Relevant Orders   Hemoglobin A1c     Breast cancer screening by mammogram       Relevant Orders   MM 3D SCREENING MAMMOGRAM BILATERAL BREAST          Thrombocytopenia Patient reports bruising and concerns about platelet levels. Previous labs have shown low platelets. - Monitor platelet levels as part of comprehensive blood count  Hypothyroidism, thyromegaly Patient is managed on Synthroid by an endocrinologist. Recent thyroid labs were normal. Discussed that a large thyroid may not reduce in size even when controlled and that surgical removal is considered if it causes symptoms like dysphagia. - Continue current management with endocrinologist  Obesity Patient discontinued Wegovy due to severe gastrointestinal side effects. Insurance issues have prevented trying other medications like [place] or Contrave. Discussed alternative weight management options with the endocrinologist. - Discuss alternative weight management options with endocrinologist  General Health Maintenance Routine physical examination. Discussed vaccinations, cancer screenings, and routine lab work. Explained that mammograms, starting at age 41, can be uncomfortable but are short-lived and have a high cancer detection rate. Initial mammograms may require additional imaging due to lack of comparison images. Discussed colon cancer screening options starting at age 75, including colonoscopy and Cologuard. - Recommend flu shot in the fall -  Discuss COVID boosters availability - Recommend yearly mammograms starting at age 74 - Order comprehensive lab work including kidney and liver function, cholesterol, vitamin D, B12, blood counts, and blood sugar - Discuss colon cancer screening options starting at age 30  Follow-up - Call and schedule mammogram at [facility] - Get labs done after checkout - Schedule next year's physical on the way out.        Return in about 1 year (around 01/09/2025) for CPE.     Shirlee Latch, MD  [facility] [phone] (phone) [phone] (fax)  [facility]

## 2024-01-10 NOTE — Patient Instructions (Signed)
 Call [facility] to schedule a mammogram [phone]

## 2024-01-11 LAB — CBC WITH DIFFERENTIAL/PLATELET
Basophils Absolute: 0 10*3/uL (ref 0.0–0.2)
Basos: 1 %
EOS (ABSOLUTE): 0.1 10*3/uL (ref 0.0–0.4)
Eos: 1 %
Hematocrit: 42.2 % (ref 34.0–46.6)
Hemoglobin: 13.6 g/dL (ref 11.1–15.9)
Immature Grans (Abs): 0 10*3/uL (ref 0.0–0.1)
Immature Granulocytes: 0 %
Lymphocytes Absolute: 2.3 10*3/uL (ref 0.7–3.1)
Lymphs: 36 %
MCH: 30 pg (ref 26.6–33.0)
MCHC: 32.2 g/dL (ref 31.5–35.7)
MCV: 93 fL (ref 79–97)
Monocytes Absolute: 0.5 10*3/uL (ref 0.1–0.9)
Monocytes: 7 %
Neutrophils Absolute: 3.6 10*3/uL (ref 1.4–7.0)
Neutrophils: 55 %
Platelets: 175 10*3/uL (ref 150–450)
RBC: 4.53 x10E6/uL (ref 3.77–5.28)
RDW: 12.2 % (ref 11.7–15.4)
WBC: 6.5 10*3/uL (ref 3.4–10.8)

## 2024-01-11 LAB — VITAMIN D 25 HYDROXY (VIT D DEFICIENCY, FRACTURES): Vit D, 25-Hydroxy: 42.2 ng/mL (ref 30.0–100.0)

## 2024-01-11 LAB — COMPREHENSIVE METABOLIC PANEL
ALT: 16 [IU]/L (ref 0–32)
AST: 26 [IU]/L (ref 0–40)
Albumin: 4.1 g/dL (ref 3.9–4.9)
Alkaline Phosphatase: 71 [IU]/L (ref 44–121)
BUN/Creatinine Ratio: 14 (ref 9–23)
BUN: 13 mg/dL (ref 6–24)
Bilirubin Total: 0.3 mg/dL (ref 0.0–1.2)
CO2: 21 mmol/L (ref 20–29)
Calcium: 9.5 mg/dL (ref 8.7–10.2)
Chloride: 105 mmol/L (ref 96–106)
Creatinine, Ser: 0.91 mg/dL (ref 0.57–1.00)
Globulin, Total: 2.9 g/dL (ref 1.5–4.5)
Glucose: 94 mg/dL (ref 70–99)
Potassium: 4.8 mmol/L (ref 3.5–5.2)
Sodium: 141 mmol/L (ref 134–144)
Total Protein: 7 g/dL (ref 6.0–8.5)
eGFR: 82 mL/min/{1.73_m2} (ref >59)

## 2024-01-11 LAB — HEMOGLOBIN A1C
Est. average glucose Bld gHb Est-mCnc: 111 mg/dL
Hgb A1c MFr Bld: 5.5 % (ref 4.8–5.6)

## 2024-01-11 LAB — LIPID PANEL
Chol/HDL Ratio: 4 {ratio} (ref 0.0–4.4)
Cholesterol, Total: 254 mg/dL — ABNORMAL HIGH (ref 100–199)
HDL: 63 mg/dL (ref >39)
LDL Chol Calc (NIH): 179 mg/dL — ABNORMAL HIGH (ref 0–99)
Triglycerides: 70 mg/dL (ref 0–149)
VLDL Cholesterol Cal: 12 mg/dL (ref 5–40)

## 2024-01-11 LAB — VITAMIN B12: Vitamin B-12: 228 pg/mL — ABNORMAL LOW (ref 232–1245)

## 2024-01-12 ENCOUNTER — Encounter: Payer: Self-pay | Admitting: Family Medicine

## 2024-01-24 ENCOUNTER — Ambulatory Visit
Admission: RE | Admit: 2024-01-24 | Discharge: 2024-01-24 | Disposition: A | Discharge status: Home / Self Care | Payer: BC Managed Care – PPO | Source: Ambulatory Visit | Attending: Family Medicine | Admitting: Family Medicine

## 2024-01-24 DIAGNOSIS — Z1231 Encounter for screening mammogram for malignant neoplasm of breast: Secondary | ICD-10-CM | POA: Diagnosis not present

## 2024-01-27 ENCOUNTER — Encounter: Payer: Self-pay | Admitting: Family Medicine

## 2024-04-10 DIAGNOSIS — E559 Vitamin D deficiency, unspecified: Secondary | ICD-10-CM | POA: Diagnosis not present

## 2024-04-10 DIAGNOSIS — E039 Hypothyroidism, unspecified: Secondary | ICD-10-CM | POA: Diagnosis not present

## 2024-10-08 ENCOUNTER — Telehealth (INDEPENDENT_AMBULATORY_CARE_PROVIDER_SITE_OTHER): Admitting: Family Medicine

## 2024-10-08 ENCOUNTER — Encounter: Payer: Self-pay | Admitting: Family Medicine

## 2024-10-08 DIAGNOSIS — M62838 Other muscle spasm: Secondary | ICD-10-CM

## 2024-10-08 MED ORDER — METHOCARBAMOL 750 MG PO TABS: 750.0000 mg | ORAL_TABLET | Freq: Three times a day (TID) | ORAL | 1 refills | Status: AC | PRN

## 2024-10-08 MED ORDER — ALBUTEROL SULFATE (2.5 MG/3ML) 0.083% IN NEBU: 2.5000 mg | INHALATION_SOLUTION | Freq: Four times a day (QID) | RESPIRATORY_TRACT | 1 refills | Status: AC | PRN

## 2024-10-08 NOTE — Progress Notes (Signed)
 MyChart Video Visit    Virtual Visit via Video Note   This format is felt to be most appropriate for this patient at this time. Physical exam was limited by quality of the video and audio technology used for the visit.    Patient location: home Provider location: [facility] Persons involved in the visit: patient, provider  I discussed the limitations of evaluation and management by telemedicine and the availability of in person appointments. The patient expressed understanding and agreed to proceed.  Patient: Chelsey Guzman   DOB: [birth year]   43 y.o. Female  MRN: [id number] Visit Date: 10/08/2024  Today's healthcare provider: Jon Eva, MD   No chief complaint on file.  Subjective    HPI   Discussed the use of AI scribe software for clinical note transcription with the patient, who gave verbal consent to proceed.  History of Present Illness   Chelsey Guzman is a 43 year old female who presents with persistent right-sided neck pain.  She has experienced right-sided neck pain for one month, which began suddenly and was initially thought to be a 'crick'. The pain persists despite using lidocaine and pain patches, affecting her sleep. It is localized to the right side of her neck, extending slightly to the shoulder, without radiation to the back or left side. Turning her head to the right exacerbates the pain, so she turns her whole body to avoid discomfort. There is no numbness, tingling, or weakness in her arms, and she performs normal activities without difficulty. Not moving her neck helps alleviate the pain.  She uses over-the-counter pain relief methods, including lidocaine patches, but finds them insufficient for long-term relief. She has not used muscle relaxers and is concerned about potential side effects, particularly drowsiness.  She requests a refill for her albuterol  nebulizer solution, which she prefers over the inhaler for her  respiratory issues.        Review of Systems      Objective    There were no vitals taken for this visit.      Physical Exam Constitutional:      General: She is not in acute distress.    Appearance: Normal appearance.  HENT:     Head: Normocephalic.  Pulmonary:     Effort: Pulmonary effort is normal. No respiratory distress.  Neurological:     Mental Status: She is alert. Mental status is at baseline.        Assessment & Plan     Problem List Items Addressed This Visit   None Visit Diagnoses       Trapezius muscle spasm    -  Primary           Right neck and trapezius muscle spasm Chronic right neck and trapezius muscle spasm for one month, localized to the right side of the neck extending to the shoulder. No associated numbness, tingling, or weakness. Pain exacerbated by movement to the right, leading to compensatory whole body movement. Likely muscular in origin, with difficulty in resolution due to the muscle's role in posture and movement. - Prescribed Robaxin (methocarbamol) up to three times a day as needed, with caution regarding drowsiness. - Recommended intermittent heat application for 20 minutes on and off. - Advised self-massage or massage by a family member. - Continue use of Tylenol, ibuprofen, and pain patches as needed. - Provided neck exercises for gradual stretching, to be initiated after muscle relaxer use. - Sent prescription to [facility] at [place] [facility].  [facility]  requires albuterol  refill. Prefers nebulizer solution over inhaler. - Prescribed albuterol  solution for nebulizer. - Sent prescription to [facility] at [facility]        Meds ordered this encounter  Medications   methocarbamol (ROBAXIN) 750 MG tablet    Sig: Take 1 tablet (750 mg total) by mouth every 8 (eight) hours as needed for muscle spasms.    Dispense:  30 tablet    Refill:  1   albuterol  (PROVENTIL ) (2.5 MG/3ML) 0.083% nebulizer solution    Sig: Take  3 mLs (2.5 mg total) by nebulization every 6 (six) hours as needed for wheezing or shortness of breath.    Dispense:  150 mL    Refill:  1     Return if symptoms worsen or fail to improve.     I discussed the assessment and treatment plan with the patient. The patient was provided an opportunity to ask questions and all were answered. The patient agreed with the plan and demonstrated an understanding of the instructions.   The patient was advised to call back or seek an in-person evaluation if the symptoms worsen or if the condition fails to improve as anticipated.    Jon Eva, MD [facility] [phone] (phone) [phone] (fax)  [facility]

## 2024-10-16 DIAGNOSIS — E039 Hypothyroidism, unspecified: Secondary | ICD-10-CM | POA: Diagnosis not present

## 2024-10-16 DIAGNOSIS — E538 Deficiency of other specified B group vitamins: Secondary | ICD-10-CM | POA: Diagnosis not present

## 2024-10-16 DIAGNOSIS — E559 Vitamin D deficiency, unspecified: Secondary | ICD-10-CM | POA: Diagnosis not present

## 2024-11-01 DIAGNOSIS — J301 Allergic rhinitis due to pollen: Secondary | ICD-10-CM | POA: Diagnosis not present

## 2024-11-01 DIAGNOSIS — J3081 Allergic rhinitis due to animal (cat) (dog) hair and dander: Secondary | ICD-10-CM | POA: Diagnosis not present

## 2024-11-01 DIAGNOSIS — L209 Atopic dermatitis, unspecified: Secondary | ICD-10-CM | POA: Diagnosis not present

## 2024-11-01 DIAGNOSIS — J3089 Other allergic rhinitis: Secondary | ICD-10-CM | POA: Diagnosis not present

## 2025-01-10 ENCOUNTER — Encounter: Payer: BC Managed Care – PPO | Admitting: Family Medicine
